# Patient Record
Sex: Male | Born: 1995 | Race: White | Hispanic: No | Marital: Single | Smoking: Never smoker
Health system: Southern US, Community
[De-identification: ages and names within clinical notes are randomized; demographics above are authoritative.]

## PROBLEM LIST (undated history)

## (undated) DIAGNOSIS — K3532 Acute appendicitis with perforation and localized peritonitis, without abscess: Secondary | ICD-10-CM

## (undated) DIAGNOSIS — K56609 Unspecified intestinal obstruction, unspecified as to partial versus complete obstruction: Secondary | ICD-10-CM

---

## 2017-01-28 ENCOUNTER — Inpatient Hospital Stay (HOSPITAL_COMMUNITY): Payer: BLUE CROSS/BLUE SHIELD

## 2017-01-28 ENCOUNTER — Encounter (HOSPITAL_COMMUNITY): Payer: Self-pay

## 2017-01-28 ENCOUNTER — Emergency Department (HOSPITAL_COMMUNITY): Payer: BLUE CROSS/BLUE SHIELD

## 2017-01-28 ENCOUNTER — Inpatient Hospital Stay (HOSPITAL_COMMUNITY)
Admission: EM | Admit: 2017-01-28 | Discharge: 2017-02-12 | DRG: 372 | Disposition: A | Discharge status: Home / Self Care | Payer: BLUE CROSS/BLUE SHIELD | Attending: Surgery | Admitting: Surgery

## 2017-01-28 DIAGNOSIS — R000 Tachycardia, unspecified: Secondary | ICD-10-CM | POA: Diagnosis present

## 2017-01-28 DIAGNOSIS — B9620 Unspecified Escherichia coli [E. coli] as the cause of diseases classified elsewhere: Secondary | ICD-10-CM | POA: Diagnosis present

## 2017-01-28 DIAGNOSIS — L0291 Cutaneous abscess, unspecified: Secondary | ICD-10-CM

## 2017-01-28 DIAGNOSIS — F17290 Nicotine dependence, other tobacco product, uncomplicated: Secondary | ICD-10-CM | POA: Diagnosis present

## 2017-01-28 DIAGNOSIS — K3533 Acute appendicitis with perforation and localized peritonitis, with abscess: Secondary | ICD-10-CM

## 2017-01-28 DIAGNOSIS — K3532 Acute appendicitis with perforation and localized peritonitis, without abscess: Secondary | ICD-10-CM | POA: Diagnosis present

## 2017-01-28 DIAGNOSIS — K353 Acute appendicitis with localized peritonitis: Secondary | ICD-10-CM | POA: Diagnosis present

## 2017-01-28 DIAGNOSIS — R188 Other ascites: Secondary | ICD-10-CM | POA: Diagnosis present

## 2017-01-28 DIAGNOSIS — K651 Peritoneal abscess: Secondary | ICD-10-CM

## 2017-01-28 DIAGNOSIS — K560 Paralytic ileus: Secondary | ICD-10-CM | POA: Diagnosis present

## 2017-01-28 DIAGNOSIS — Z0189 Encounter for other specified special examinations: Secondary | ICD-10-CM

## 2017-01-28 LAB — URINALYSIS, ROUTINE W REFLEX MICROSCOPIC
Bacteria, UA: NONE SEEN
Bilirubin Urine: NEGATIVE
Glucose, UA: NEGATIVE mg/dL
HGB URINE DIPSTICK: NEGATIVE
Ketones, ur: NEGATIVE mg/dL
Leukocytes, UA: NEGATIVE
Nitrite: NEGATIVE
PH: 5 (ref 5.0–8.0)
Protein, ur: 30 mg/dL — AB
Squamous Epithelial / LPF: NONE SEEN

## 2017-01-28 LAB — CBC
HCT: 40.6 % (ref 39.0–52.0)
Hemoglobin: 14.6 g/dL (ref 13.0–17.0)
MCH: 29.2 pg (ref 26.0–34.0)
MCHC: 36 g/dL (ref 30.0–36.0)
MCV: 81.2 fL (ref 78.0–100.0)
PLATELETS: 275 10*3/uL (ref 150–400)
RBC: 5 MIL/uL (ref 4.22–5.81)
RDW: 13.1 % (ref 11.5–15.5)
WBC: 21.8 10*3/uL — ABNORMAL HIGH (ref 4.0–10.5)

## 2017-01-28 LAB — COMPREHENSIVE METABOLIC PANEL
ALBUMIN: 3.6 g/dL (ref 3.5–5.0)
ALK PHOS: 79 U/L (ref 38–126)
ALT: 34 U/L (ref 17–63)
AST: 31 U/L (ref 15–41)
Anion gap: 13 (ref 5–15)
BILIRUBIN TOTAL: 1.8 mg/dL — AB (ref 0.3–1.2)
BUN: 24 mg/dL — ABNORMAL HIGH (ref 6–20)
CALCIUM: 9.3 mg/dL (ref 8.9–10.3)
CO2: 23 mmol/L (ref 22–32)
CREATININE: 1.22 mg/dL (ref 0.61–1.24)
Chloride: 94 mmol/L — ABNORMAL LOW (ref 101–111)
GFR calc Af Amer: >60 mL/min (ref >60)
GFR calc non Af Amer: >60 mL/min (ref >60)
Glucose, Bld: 127 mg/dL — ABNORMAL HIGH (ref 65–99)
Potassium: 3.7 mmol/L (ref 3.5–5.1)
SODIUM: 130 mmol/L — AB (ref 135–145)
Total Protein: 8 g/dL (ref 6.5–8.1)

## 2017-01-28 LAB — LIPASE, BLOOD: Lipase: 35 U/L (ref 11–51)

## 2017-01-28 LAB — I-STAT CG4 LACTIC ACID, ED: LACTIC ACID, VENOUS: 1.3 mmol/L (ref 0.5–1.9)

## 2017-01-28 MED ORDER — IOPAMIDOL (ISOVUE-300) INJECTION 61%
INTRAVENOUS | Status: AC
Start: 2017-01-28 — End: 2017-01-29
  Filled 2017-01-28: qty 100

## 2017-01-28 MED ORDER — ONDANSETRON 4 MG PO TBDP
4.0000 mg | ORAL_TABLET | Freq: Four times a day (QID) | ORAL | Status: DC | PRN
  Administered 2017-02-06: 4 mg via ORAL
  Filled 2017-01-28: qty 1

## 2017-01-28 MED ORDER — SODIUM CHLORIDE 0.9 % IV BOLUS (SEPSIS)
1000.0000 mL | Freq: Once | INTRAVENOUS | Status: AC
  Administered 2017-01-28: 1000 mL via INTRAVENOUS

## 2017-01-28 MED ORDER — KCL IN DEXTROSE-NACL 20-5-0.45 MEQ/L-%-% IV SOLN
INTRAVENOUS | Status: AC
  Administered 2017-01-28 – 2017-01-30 (×7): via INTRAVENOUS
  Filled 2017-01-28 (×6): qty 1000

## 2017-01-28 MED ORDER — METHOCARBAMOL 1000 MG/10ML IJ SOLN
500.0000 mg | Freq: Three times a day (TID) | INTRAVENOUS | Status: DC
  Administered 2017-01-28 – 2017-02-11 (×41): 500 mg via INTRAVENOUS
  Filled 2017-01-28 (×12): qty 550
  Filled 2017-01-28: qty 5
  Filled 2017-01-28 (×12): qty 550
  Filled 2017-01-28: qty 5
  Filled 2017-01-28 (×17): qty 550

## 2017-01-28 MED ORDER — HYDROMORPHONE HCL-NACL 0.5-0.9 MG/ML-% IV SOSY
0.5000 mg | PREFILLED_SYRINGE | INTRAVENOUS | Status: DC | PRN
  Administered 2017-01-28 (×2): 0.5 mg via INTRAVENOUS
  Administered 2017-01-29 – 2017-01-31 (×15): 1 mg via INTRAVENOUS
  Administered 2017-01-31: 10:00:00 0.5 mg via INTRAVENOUS
  Administered 2017-01-31: 05:00:00 1 mg via INTRAVENOUS
  Administered 2017-02-01 – 2017-02-09 (×51): 0.5 mg via INTRAVENOUS
  Administered 2017-02-10 (×2): 1 mg via INTRAVENOUS
  Administered 2017-02-10 (×2): 0.5 mg via INTRAVENOUS
  Filled 2017-01-28 (×2): qty 1
  Filled 2017-01-28 (×4): qty 2
  Filled 2017-01-28 (×3): qty 1
  Filled 2017-01-28: qty 2
  Filled 2017-01-28 (×5): qty 1
  Filled 2017-01-28: qty 2
  Filled 2017-01-28: qty 1
  Filled 2017-01-28: qty 2
  Filled 2017-01-28: qty 1
  Filled 2017-01-28 (×3): qty 2
  Filled 2017-01-28: qty 1
  Filled 2017-01-28: qty 2
  Filled 2017-01-28: qty 1
  Filled 2017-01-28 (×2): qty 2
  Filled 2017-01-28: qty 1
  Filled 2017-01-28: qty 2
  Filled 2017-01-28: qty 1
  Filled 2017-01-28 (×2): qty 2
  Filled 2017-01-28 (×10): qty 1
  Filled 2017-01-28: qty 2
  Filled 2017-01-28 (×13): qty 1
  Filled 2017-01-28: qty 2
  Filled 2017-01-28 (×3): qty 1
  Filled 2017-01-28: qty 2
  Filled 2017-01-28 (×2): qty 1
  Filled 2017-01-28: qty 2
  Filled 2017-01-28 (×8): qty 1
  Filled 2017-01-28 (×2): qty 2
  Filled 2017-01-28 (×2): qty 1
  Filled 2017-01-28: qty 2
  Filled 2017-01-28 (×2): qty 1

## 2017-01-28 MED ORDER — PIPERACILLIN-TAZOBACTAM 3.375 G IVPB 30 MIN
3.3750 g | Freq: Once | INTRAVENOUS | Status: AC
  Administered 2017-01-28: 3.375 g via INTRAVENOUS
  Filled 2017-01-28: qty 50

## 2017-01-28 MED ORDER — IOPAMIDOL (ISOVUE-300) INJECTION 61%
100.0000 mL | Freq: Once | INTRAVENOUS | Status: AC | PRN
  Administered 2017-01-28: 100 mL via INTRAVENOUS

## 2017-01-28 MED ORDER — MORPHINE SULFATE (PF) 4 MG/ML IV SOLN
4.0000 mg | Freq: Once | INTRAVENOUS | Status: AC
  Administered 2017-01-28: 4 mg via INTRAVENOUS
  Filled 2017-01-28: qty 1

## 2017-01-28 MED ORDER — PIPERACILLIN-TAZOBACTAM 3.375 G IVPB
3.3750 g | Freq: Three times a day (TID) | INTRAVENOUS | Status: DC
  Administered 2017-01-28 – 2017-01-31 (×7): 3.375 g via INTRAVENOUS
  Filled 2017-01-28 (×7): qty 50

## 2017-01-28 MED ORDER — ONDANSETRON HCL 4 MG/2ML IJ SOLN
4.0000 mg | Freq: Once | INTRAMUSCULAR | Status: DC
  Filled 2017-01-28: qty 2

## 2017-01-28 MED ORDER — DIPHENHYDRAMINE HCL 25 MG PO CAPS: 25.0000 mg | ORAL_CAPSULE | Freq: Four times a day (QID) | ORAL | Status: DC | PRN

## 2017-01-28 MED ORDER — ONDANSETRON HCL 4 MG/2ML IJ SOLN
4.0000 mg | Freq: Four times a day (QID) | INTRAMUSCULAR | Status: DC | PRN
  Administered 2017-01-28 – 2017-02-06 (×10): 4 mg via INTRAVENOUS
  Filled 2017-01-28 (×8): qty 2

## 2017-01-28 MED ORDER — ACETAMINOPHEN 500 MG PO TABS
500.0000 mg | ORAL_TABLET | ORAL | Status: DC | PRN
  Administered 2017-02-07: 500 mg via ORAL
  Filled 2017-01-28: qty 1

## 2017-01-28 MED ORDER — DIPHENHYDRAMINE HCL 50 MG/ML IJ SOLN: 25.0000 mg | Freq: Four times a day (QID) | INTRAMUSCULAR | Status: DC | PRN

## 2017-01-28 MED ORDER — ONDANSETRON HCL 4 MG/2ML IJ SOLN
4.0000 mg | Freq: Once | INTRAMUSCULAR | Status: AC
  Administered 2017-01-28: 4 mg via INTRAVENOUS
  Filled 2017-01-28: qty 2

## 2017-01-28 NOTE — Consult Note (Signed)
Chief Complaint: Patient was seen in consultation today for CT-guided pelvic abscess drainage Chief Complaint  Patient presents with  . Nausea  . Abdominal Pain    bloating  . Abnormal Lab    WBC-21.5  . Emesis     Referring Physician(s): Martin,M  Supervising Physician: Simonne Come  Patient Status: [facility] - In-pt  History of Present Illness: Adam Mcdowell is a 21 y.o. male who was referred to [facility] ED today from [facility] secondary to 4 day history of abdominal pain, bloating, sweats, nausea, vomiting. Subsequent CT scan revealed:  1. Acute, ruptured appendicitis with two large intra-abdominal abscesses, one inferior to the liver and tracking along the right paracolic gutter, and a second one in the deep pelvis extending along the left inferior paracolic gutter. 2. Small bowel obstruction to the level of the distal ileum, where there is a transition point that is likely related to the prominent mesenteric reactive changes. 3. Trace right pleural effusion  Request now received from surgery for CT-guided drainage of the pelvic abscess. Latest temp 99.6, WBC  21.8, creatinine 1.22.  History reviewed. No pertinent past medical history.  History reviewed. No pertinent surgical history.  Allergies: Patient has no known allergies.  Medications: Prior to Admission medications   Medication Sig Start Date End Date Taking? Authorizing Provider  ibuprofen (ADVIL,MOTRIN) 200 MG tablet Take 400 mg by mouth every 6 (six) hours as needed for moderate pain or cramping.   Yes [provider]     Family History  Problem Relation Age of Onset  . Hypertension Mother     Social History   Social History  . Marital status: Single    Spouse name: N/A  . Number of children: N/A  . Years of education: N/A   Social History Main Topics  . Smoking status: Current Every Day Smoker    Types: E-cigarettes  . Smokeless tobacco: Never Used  . Alcohol  use No  . Drug use: No  . Sexual activity: Not Asked   Other Topics Concern  . None   Social History Narrative  . None      Review of Systems see above  Vital Signs: BP 132/75 (BP Location: Right Arm)   Pulse 100   Temp 99.6 F (37.6 C) (Oral)   Resp 20   Ht 5' 5.5" (1.664 m)   Wt 175 lb (79.4 kg)   SpO2 98%   BMI 28.68 kg/m   Physical Exam awake,slightly drowsy; NG tube in place; chest with slightly diminished breath sounds right base, left clear. Heart with slightly tachycardic but regular rhythm; abdomen distended, few bowel sounds, mod tender right lower quadrant; no LE edema  Mallampati Score:     Imaging: Ct Abdomen Pelvis W Contrast  Result Date: 01/28/2017 CLINICAL DATA:  Abdominal pain with nausea and vomiting. EXAM: CT ABDOMEN AND PELVIS WITH CONTRAST TECHNIQUE: Multidetector CT imaging of the abdomen and pelvis was performed using the standard protocol following bolus administration of intravenous contrast. CONTRAST:  ISOVUE-300 IOPAMIDOL (ISOVUE-300) INJECTION 61% COMPARISON:  None. FINDINGS: Lower chest:  Trace right pleural effusion. Hepatobiliary: No focal liver abnormality is seen. No gallstones, gallbladder wall thickening, or biliary dilatation. Pancreas: Unremarkable. No pancreatic ductal dilatation or surrounding inflammatory changes. Spleen: Normal in size without focal abnormality. Adrenals/Urinary Tract: Adrenal glands are unremarkable. Kidneys are normal, without renal calculi, focal lesion, or hydronephrosis. Mild prominence of the right greater than left mid ureters is likely reactive. Bladder is  unremarkable. Stomach/Bowel: The appendix is markedly dilated with surrounding periappendiceal inflammatory stranding and an appendicolith at the appendix base. There are multiple dilated loops of small bowel with air-fluid levels to the level of the distal ileum. A few loops of proximal ileum remain nondilated. There is reactive wall thickening of the  cecum and ascending colon, as well as the terminal ileum. The stomach is within normal limits. Vascular/Lymphatic: No significant vascular findings are present. No enlarged abdominal or pelvic lymph nodes. Multiple prominent mesenteric lymph nodes are likely reactive. Reproductive: Prostate is unremarkable. Other: There is a loculated, rim enhancing fluid collection inferior to the liver, measuring approximately 11.0 x 7.3 x 19.1 cm, extending along the right paracolic gutter into the pelvis. There is an additional loculated fluid collection containing a few foci of air in the deep pelvis between the bladder and rectum measuring approximately 2.3 x 4.4 x 2.6 cm. This fluid collection extends superiorly along the bilateral paracolic gutters. The larger portion in the left lower pelvis measures approximately 6.6 x 5.1 x 5.4 cm. Musculoskeletal: No acute or significant osseous findings. IMPRESSION: 1. Acute, ruptured appendicitis with two large intra-abdominal abscesses, one inferior to the liver and tracking along the right paracolic gutter, and a second one in the deep pelvis extending along the left inferior paracolic gutter. 2. Small bowel obstruction to the level of the distal ileum, where there is a transition point that is likely related to the prominent mesenteric reactive changes. 3. Trace right pleural effusion. These results were called by telephone at the time of interpretation on 01/28/2017 at 1:11 pm to Dr. Shaune PollackAMERON ISAACS , who verbally acknowledged these results. Electronically Signed   By: Obie DredgeWilliam T Derry M.D.   On: 01/28/2017 13:13   Dg Abd Portable 1v  Result Date: 01/28/2017 CLINICAL DATA:  21 year old male with ruptured appendicitis. Nasogastric tube placement EXAM: PORTABLE ABDOMEN - 1 VIEW COMPARISON:  CT of the same day FINDINGS: Gastric tube terminates within the left upper quadrant. Gaseous distention of small bowel with relative decompression of the colon. Excreted contrast within the  bilateral kidneys and ureters. IMPRESSION: Gastric tube terminates within the left upper quadrant. Gaseous distention of small bowel, either ileus or small-bowel obstruction. Excreted contrast within the bilateral collecting system and ureters. Electronically Signed   By: Gilmer MorJaime  Wagner D.O.   On: 01/28/2017 15:58    Labs:  CBC:  Recent Labs  01/28/17 1120  WBC 21.8*  HGB 14.6  HCT 40.6  PLT 275    COAGS: No results for input(s): INR, APTT in the last 8760 hours.  BMP:  Recent Labs  01/28/17 1120  NA 130*  K 3.7  CL 94*  CO2 23  GLUCOSE 127*  BUN 24*  CALCIUM 9.3  CREATININE 1.22  GFRNONAA >60  GFRAA >60    LIVER FUNCTION TESTS:  Recent Labs  01/28/17 1120  BILITOT 1.8*  AST 31  ALT 34  ALKPHOS 79  PROT 8.0  ALBUMIN 3.6    TUMOR MARKERS: No results for input(s): AFPTM, CEA, CA199, CHROMGRNA in the last 8760 hours.  Assessment and Plan: 21 y.o. male who [facility] to 4 day history of abdominal pain, bloating, sweats, nausea, vomiting. Subsequent CT scan revealed:  1. Acute, ruptured appendicitis with two large intra-abdominal abscesses, one inferior to the liver and tracking along the right paracolic gutter, and a second one in the deep pelvis extending along the left inferior paracolic gutter.  2. Small bowel obstruction to the level of the distal ileum, where there is a transition point that is likely related to the prominent mesenteric reactive changes. 3. Trace right pleural effusion  Request now received from surgery for CT-guided drainage of the pelvic abscess. Latest temp 99.6, WBC  21.8, creatinine 1.22. Imaging studies have been reviewed by Dr. Fredia Sorrow and abscess appears amenable to drainage.Risks and benefits discussed with the patient/father including bleeding, infection, damage to adjacent structures, bowel perforation/fistula connection, and sepsis.All of the patient's  questions were answered, patient is agreeable to proceed.Consent signed and in chart. Procedure scheduled for 9/11.     Thank you for this interesting consult.  I greatly enjoyed meeting Adam Mcdowell and look forward to participating in their care.  A copy of this report was sent to the requesting provider on this date.  Electronically Signed: D. Jeananne Rama, PA-C 01/28/2017, 4:09 PM   I spent a total of 25 minutes in face to face in clinical consultation, greater than 50% of which was counseling/coordinating care for CT guided drainage of pelvic abscess

## 2017-01-28 NOTE — ED Triage Notes (Addendum)
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 Patient had a WBC-21.5, abdominal pain x 2 days with N/V. No BM in 2 days. Patient states he began having abdominal distention and bloating x 4 days ago.

## 2017-01-28 NOTE — H&P (Signed)
[facility] Admission Note  Adam Mcdowell 1995-08-25  [id number].    Requesting MD: Adam Mcdowell, M.D. Chief Complaint/Reason for Consult:ruptured appendicitis  HPI:  [name] is a 21 year old white male with no previous past medical history who presented to [place] emergency department from the [place] [facility] with a chief complaint of 4 days of abdominal pain. The patient reports right lower quadrant abdominal pain that started 9/7. Pain described as sharp, constant right lower quadrant pain with intermittent radiation to central lower abdomen. Denies aggravating or alleviating factors. Associated symptoms include abdominal bloating, belching, hiccups, cold sweats, nausea, and vomiting. The patient reports a small loose bowel movement this morning but states bowel function has been irregular. He initially thought he had the flu and attempted to take a cold and flu medication that he got over-the-counter with little relief of symptoms. Due to persistent pain and intolerance of oral intake he presented to the emergency department for evaluation. The patient denies a history of high blood pressure, diabetes, asthma, or cardiac issues. He denies regular medication use. He denies alcohol, tobacco, or illicit drug use. The patient's father is at the bedside.  Workup in the emergency department was significant for leukocytosis of 21.5 and a CT scan showing a dilated appendix with a appendicolith at the base associated with inflammatory stranding,dilated small bowel, and reactive wall thickening of the cecum and ascending colon. Also noted are two, large intra-abdominal abscesses-one 11 x 7 x 19 cm abscess inferior to the liver and one abscess in the pelvis and paracolic gutter.   ROS: Review of Systems  Constitutional: Positive for chills and fever.  Respiratory: Negative for sputum production.   Cardiovascular: Negative for chest pain.  Gastrointestinal: Positive for  abdominal pain, nausea and vomiting. Negative for blood in stool and melena.  Genitourinary: Negative.   Musculoskeletal: Negative.   All other systems reviewed and are negative.   Family History  Problem Relation Age of Onset  . Hypertension Mother     History reviewed. No pertinent past medical history.  History reviewed. No pertinent surgical history.  Social History:  reports that he has been smoking E-cigarettes.  He has never used smokeless tobacco. He reports that he does not drink alcohol or use drugs.  Allergies: No Known Allergies   (Not in a hospital admission)  Blood pressure 134/80, pulse (!) 102, temperature 98.3 F (36.8 C), temperature source Oral, resp. rate 16, height 5' 5.5" (1.664 m), weight 79.4 kg (175 lb), SpO2 99 %. Physical Exam: Physical Exam  Constitutional: He is oriented to person, place, and time. He appears well-developed and well-nourished. No distress.  Appears uncomfortable, belching regularly  HENT:  Head: Normocephalic and atraumatic.  Right Ear: External ear normal.  Left Ear: External ear normal.  Mouth/Throat: Oropharynx is clear and moist.  Eyes: Pupils are equal, round, and reactive to light. EOM are normal. Right eye exhibits no discharge. Left eye exhibits no discharge.  Small left subconjunctival hemorrhage  Neck: Normal range of motion. Neck supple. No tracheal deviation present.  Cardiovascular: Normal rate, regular rhythm, normal heart sounds and intact distal pulses.  Exam reveals no friction rub.   No murmur heard. Pulmonary/Chest: Effort normal and breath sounds normal. No stridor. No respiratory distress. He has no wheezes. He has no rales. He exhibits no tenderness.  Abdominal: Soft. He exhibits distension. He exhibits no mass. There is tenderness. There is rebound and guarding. No hernia.  Protuberant, tympanic abdomen. Global tenderness, worse  in the right lower quadrant, with guarding.  Musculoskeletal: Normal range of  motion. He exhibits no edema, tenderness or deformity.  Lymphadenopathy:    He has no cervical adenopathy.  Neurological: He is alert and oriented to person, place, and time. No sensory deficit.  Skin: Skin is warm and dry. No rash noted. He is not diaphoretic.  Psychiatric: He has a normal mood and affect. His behavior is normal.    Results for orders placed or performed during the hospital encounter of 01/28/17 (from the past 48 hour(s))  Lipase, blood     Status: None   Collection Time: 01/28/17 11:20 AM  Result Value Ref Range   Lipase 35 11 - 51 U/L  Comprehensive metabolic panel     Status: Abnormal   Collection Time: 01/28/17 11:20 AM  Result Value Ref Range   Sodium 130 (L) 135 - 145 mmol/L   Potassium 3.7 3.5 - 5.1 mmol/L   Chloride 94 (L) 101 - 111 mmol/L   CO2 23 22 - 32 mmol/L   Glucose, Bld 127 (H) 65 - 99 mg/dL   BUN 24 (H) 6 - 20 mg/dL   Creatinine, Ser 1.22 0.61 - 1.24 mg/dL   Calcium 9.3 8.9 - 10.3 mg/dL   Total Protein 8.0 6.5 - 8.1 g/dL   Albumin 3.6 3.5 - 5.0 g/dL   AST 31 15 - 41 U/L   ALT 34 17 - 63 U/L   Alkaline Phosphatase 79 38 - 126 U/L   Total Bilirubin 1.8 (H) 0.3 - 1.2 mg/dL   GFR calc non Af Amer >60 >60 mL/min   GFR calc Af Amer >60 >60 mL/min    Comment: (NOTE) The eGFR has been calculated using the CKD EPI equation. This calculation has not been validated in all clinical situations. eGFR's persistently <60 mL/min signify possible Chronic Kidney Disease.    Anion gap 13 5 - 15  CBC     Status: Abnormal   Collection Time: 01/28/17 11:20 AM  Result Value Ref Range   WBC 21.8 (H) 4.0 - 10.5 K/uL   RBC 5.00 4.22 - 5.81 MIL/uL   Hemoglobin 14.6 13.0 - 17.0 g/dL   HCT 40.6 39.0 - 52.0 %   MCV 81.2 78.0 - 100.0 fL   MCH 29.2 26.0 - 34.0 pg   MCHC 36.0 30.0 - 36.0 g/dL   RDW 13.1 11.5 - 15.5 %   Platelets 275 150 - 400 K/uL  I-Stat CG4 Lactic Acid, ED     Status: None   Collection Time: 01/28/17 11:52 AM  Result Value Ref Range   Lactic  Acid, Venous 1.30 0.5 - 1.9 mmol/L   Ct Abdomen Pelvis W Contrast  Result Date: 01/28/2017 CLINICAL DATA:  Abdominal pain with nausea and vomiting. EXAM: CT ABDOMEN AND PELVIS WITH CONTRAST TECHNIQUE: Multidetector CT imaging of the abdomen and pelvis was performed using the standard protocol following bolus administration of intravenous contrast. CONTRAST:  174m ISOVUE-300 IOPAMIDOL (ISOVUE-300) INJECTION 61% COMPARISON:  None. FINDINGS: Lower chest:  Trace right pleural effusion. Hepatobiliary: No focal liver abnormality is seen. No gallstones, gallbladder wall thickening, or biliary dilatation. Pancreas: Unremarkable. No pancreatic ductal dilatation or surrounding inflammatory changes. Spleen: Normal in size without focal abnormality. Adrenals/Urinary Tract: Adrenal glands are unremarkable. Kidneys are normal, without renal calculi, focal lesion, or hydronephrosis. Mild prominence of the right greater than left mid ureters is likely reactive. Bladder is unremarkable. Stomach/Bowel: The appendix is markedly dilated with surrounding periappendiceal inflammatory stranding and an  appendicolith at the appendix base. There are multiple dilated loops of small bowel with air-fluid levels to the level of the distal ileum. A few loops of proximal ileum remain nondilated. There is reactive wall thickening of the cecum and ascending colon, as well as the terminal ileum. The stomach is within normal limits. Vascular/Lymphatic: No significant vascular findings are present. No enlarged abdominal or pelvic lymph nodes. Multiple prominent mesenteric lymph nodes are likely reactive. Reproductive: Prostate is unremarkable. Other: There is a loculated, rim enhancing fluid collection inferior to the liver, measuring approximately 11.0 x 7.3 x 19.1 cm, extending along the right paracolic gutter into the pelvis. There is an additional loculated fluid collection containing a few foci of air in the deep pelvis between the bladder  and rectum measuring approximately 2.3 x 4.4 x 2.6 cm. This fluid collection extends superiorly along the bilateral paracolic gutters. The larger portion in the left lower pelvis measures approximately 6.6 x 5.1 x 5.4 cm. Musculoskeletal: No acute or significant osseous findings. IMPRESSION: 1. Acute, ruptured appendicitis with two large intra-abdominal abscesses, one inferior to the liver and tracking along the right paracolic gutter, and a second one in the deep pelvis extending along the left inferior paracolic gutter. 2. Small bowel obstruction to the level of the distal ileum, where there is a transition point that is likely related to the prominent mesenteric reactive changes. 3. Trace right pleural effusion. These results were called by telephone at the time of interpretation on 01/28/2017 at 1:11 pm to Dr. Duffy Mcdowell , who verbally acknowledged these results. Electronically Signed   By: Titus Dubin M.D.   On: 01/28/2017 13:13   Assessment/Plan Acute appendicitis with rupture and multiple intra-abdominal abscesses Admit to [facility] service NPO/bowel rest, IV fluids, place nasogastric tube IV Zosyn Consult interventional radiology for percutaneous drainage of fluid collections.  PRN pain control with IV Dilaudid and Robaxin as well as PRN antiemetics Mobilize as tolerated A.M. labs   Afebrile in ED. Mild sinus tachycardia. Normotensive. Follow vitals and exam. Failure to improve on IV abx or with perc drainage may warrant surgery.   Jill Alexanders, [facility] Surgery 01/28/2017, 2:08 PM Pager: [phone] Consults: [phone] Mon-Fri 7:00 am-4:30 pm Sat-Sun 7:00 am-11:30 am

## 2017-01-28 NOTE — ED Notes (Signed)
Urinal is @ bedside. Pt cannot use restroom at the time.

## 2017-01-28 NOTE — ED Notes (Signed)
Bed: WLPT1 Expected date:  Expected time:  Means of arrival:  Comments: 

## 2017-01-28 NOTE — ED Notes (Signed)
Bed: WTR6 Expected date:  Expected time:  Means of arrival:  Comments: 

## 2017-01-28 NOTE — ED Provider Notes (Signed)
[facility]-EMERGENCY DEPT Provider Note   CSN: [id number] Arrival date & time: 01/28/17  1038     History   Chief Complaint Chief Complaint  Patient presents with  . Nausea  . Abdominal Pain    bloating  . Abnormal Lab    WBC-21.5  . Emesis    HPI Adam Mcdowell is a 21 y.o. male.  HPI   21 yo M here with abdominal pain. Pt reports that his sx started 2-3 days ago. He began with aching, dull, throbbing RLQ pain. Over the next 24 hours his pain generalized to his lower abdomen, then got slightly better yesterday. However, last night he began developing worsening abd pain with nausea, constipation, and diffuse chills. No flank pain. No dysuria.  Pain is worse with palpation and movement. No alleviating factors. He has had no appetite.  History reviewed. No pertinent past medical history.  Patient Active Problem List   Diagnosis Date Noted  . Acute appendicitis with rupture 01/28/2017    History reviewed. No pertinent surgical history.     Home Medications    Prior to Admission medications   Medication Sig Start Date End Date Taking? Authorizing Provider  ibuprofen (ADVIL,MOTRIN) 200 MG tablet Take 400 mg by mouth every 6 (six) hours as needed for moderate pain or cramping.   Yes [provider]    Family History Family History  Problem Relation Age of Onset  . Hypertension Mother     Social History Social History  Substance Use Topics  . Smoking status: Current Every Day Smoker    Types: E-cigarettes  . Smokeless tobacco: Never Used  . Alcohol use No     Allergies   Patient has no known allergies.   Review of Systems Review of Systems  Constitutional: Positive for chills and fatigue. Negative for fever.  HENT: Negative for congestion and rhinorrhea.   Eyes: Negative for visual disturbance.  Respiratory: Negative for cough, shortness of breath and wheezing.   Cardiovascular: Negative for chest pain and leg swelling.  Gastrointestinal: Positive  for abdominal distention, abdominal pain, nausea and vomiting. Negative for diarrhea.  Genitourinary: Negative for dysuria and flank pain.  Musculoskeletal: Negative for neck pain and neck stiffness.  Skin: Negative for rash and wound.  Allergic/Immunologic: Negative for immunocompromised state.  Neurological: Negative for syncope, weakness and headaches.  All other systems reviewed and are negative.    Physical Exam Updated Vital Signs BP 132/75 (BP Location: Right Arm)   Pulse 100   Temp 99.6 F (37.6 C) (Oral)   Resp 20   Ht 5' 5.5" (1.664 m)   Wt 79.4 kg (175 lb)   SpO2 98%   BMI 28.68 kg/m   Physical Exam  Constitutional: He is oriented to person, place, and time. He appears well-developed and well-nourished. No distress.  HENT:  Head: Normocephalic and atraumatic.  Eyes: Conjunctivae are normal.  Neck: Neck supple.  Cardiovascular: Normal rate, regular rhythm and normal heart sounds.  Exam reveals no friction rub.   No murmur heard. Pulmonary/Chest: Effort normal and breath sounds normal. No respiratory distress. He has no wheezes. He has no rales.  Abdominal: Soft. He exhibits distension. There is tenderness. There is rebound and guarding.  Marked abdominal distension with guarding, rebound throughout. TTP is worst in RLQ.   Musculoskeletal: He exhibits no edema.  Neurological: He is alert and oriented to person, place, and time. He exhibits normal muscle tone.  Skin: Skin is warm. Capillary refill takes less than 2 seconds.  Psychiatric: He has a normal mood and affect.  Nursing note and vitals reviewed.    ED Treatments / Results  Labs (all labs ordered are listed, but only abnormal results are displayed) Labs Reviewed  COMPREHENSIVE METABOLIC PANEL - Abnormal; Notable for the following:       Result Value   Sodium 130 (*)    Chloride 94 (*)    Glucose, Bld 127 (*)    BUN 24 (*)    Total Bilirubin 1.8 (*)    All other components within normal limits  CBC  - Abnormal; Notable for the following:    WBC 21.8 (*)    All other components within normal limits  URINALYSIS, ROUTINE W REFLEX MICROSCOPIC - Abnormal; Notable for the following:    Specific Gravity, Urine >1.046 (*)    Protein, ur 30 (*)    All other components within normal limits  LIPASE, BLOOD  HIV ANTIBODY (ROUTINE TESTING)  BASIC METABOLIC PANEL  CBC  PROTIME-INR  I-STAT CG4 LACTIC ACID, ED  I-STAT CG4 LACTIC ACID, ED    EKG  EKG Interpretation None       Radiology Ct Abdomen Pelvis W Contrast  Result Date: 01/28/2017 CLINICAL DATA:  Abdominal pain with nausea and vomiting. EXAM: CT ABDOMEN AND PELVIS WITH CONTRAST TECHNIQUE: Multidetector CT imaging of the abdomen and pelvis was performed using the standard protocol following bolus administration of intravenous contrast. CONTRAST:  100mL ISOVUE-300 IOPAMIDOL (ISOVUE-300) INJECTION 61% COMPARISON:  None. FINDINGS: Lower chest:  Trace right pleural effusion. Hepatobiliary: No focal liver abnormality is seen. No gallstones, gallbladder wall thickening, or biliary dilatation. Pancreas: Unremarkable. No pancreatic ductal dilatation or surrounding inflammatory changes. Spleen: Normal in size without focal abnormality. Adrenals/Urinary Tract: Adrenal glands are unremarkable. Kidneys are normal, without renal calculi, focal lesion, or hydronephrosis. Mild prominence of the right greater than left mid ureters is likely reactive. Bladder is unremarkable. Stomach/Bowel: The appendix is markedly dilated with surrounding periappendiceal inflammatory stranding and an appendicolith at the appendix base. There are multiple dilated loops of small bowel with air-fluid levels to the level of the distal ileum. A few loops of proximal ileum remain nondilated. There is reactive wall thickening of the cecum and ascending colon, as well as the terminal ileum. The stomach is within normal limits. Vascular/Lymphatic: No significant vascular findings are  present. No enlarged abdominal or pelvic lymph nodes. Multiple prominent mesenteric lymph nodes are likely reactive. Reproductive: Prostate is unremarkable. Other: There is a loculated, rim enhancing fluid collection inferior to the liver, measuring approximately 11.0 x 7.3 x 19.1 cm, extending along the right paracolic gutter into the pelvis. There is an additional loculated fluid collection containing a few foci of air in the deep pelvis between the bladder and rectum measuring approximately 2.3 x 4.4 x 2.6 cm. This fluid collection extends superiorly along the bilateral paracolic gutters. The larger portion in the left lower pelvis measures approximately 6.6 x 5.1 x 5.4 cm. Musculoskeletal: No acute or significant osseous findings. IMPRESSION: 1. Acute, ruptured appendicitis with two large intra-abdominal abscesses, one inferior to the liver and tracking along the right paracolic gutter, and a second one in the deep pelvis extending along the left inferior paracolic gutter. 2. Small bowel obstruction to the level of the distal ileum, where there is a transition point that is likely related to the prominent mesenteric reactive changes. 3. Trace right pleural effusion. These results were called by telephone at the time of interpretation on 01/28/2017 at 1:11 pm to Dr.  Shaune Pollack , who verbally acknowledged these results. Electronically Signed   By: Obie Dredge M.D.   On: 01/28/2017 13:13   Dg Abd Portable 1v  Result Date: 01/28/2017 CLINICAL DATA:  21 year old male with ruptured appendicitis. Nasogastric tube placement EXAM: PORTABLE ABDOMEN - 1 VIEW COMPARISON:  CT of the same day FINDINGS: Gastric tube terminates within the left upper quadrant. Gaseous distention of small bowel with relative decompression of the colon. Excreted contrast within the bilateral kidneys and ureters. IMPRESSION: Gastric tube terminates within the left upper quadrant. Gaseous distention of small bowel, either ileus or  small-bowel obstruction. Excreted contrast within the bilateral collecting system and ureters. Electronically Signed   By: Gilmer Mor D.O.   On: 01/28/2017 15:58    Procedures .Critical Care Performed by: Shaune Pollack Authorized by: Shaune Pollack   Critical care provider statement:    Critical care time (minutes):  35   Critical care time was exclusive of:  Separately billable procedures and treating other patients and teaching time   Critical care was necessary to treat or prevent imminent or life-threatening deterioration of the following conditions:  Dehydration, hepatic failure, metabolic crisis, shock and sepsis   Critical care was time spent personally by me on the following activities:  Development of treatment plan with patient or surrogate, discussions with consultants, evaluation of patient's response to treatment, examination of patient, obtaining history from patient or surrogate, ordering and performing treatments and interventions, ordering and review of laboratory studies, ordering and review of radiographic studies, pulse oximetry, re-evaluation of patient's condition and review of old charts   I assumed direction of critical care for this patient from another provider in my specialty: no     (including critical care time)  Medications Ordered in ED Medications  piperacillin-tazobactam (ZOSYN) IVPB 3.375 g (not administered)  ondansetron (ZOFRAN) injection 4 mg (not administered)  dextrose 5 % and 0.45 % NaCl with KCl 20 mEq/L infusion ( Intravenous New Bag/Given 01/28/17 1550)  HYDROmorphone (DILAUDID) injection 0.5-1 mg (0.5 mg Intravenous Given 01/28/17 1845)  diphenhydrAMINE (BENADRYL) capsule 25 mg (not administered)    Or  diphenhydrAMINE (BENADRYL) injection 25 mg (not administered)  ondansetron (ZOFRAN-ODT) disintegrating tablet 4 mg ( Oral See Alternative 01/28/17 1550)    Or  ondansetron (ZOFRAN) injection 4 mg (4 mg Intravenous Given 01/28/17 1550)    methocarbamol (ROBAXIN) 500 mg in dextrose 5 % 50 mL IVPB (0 mg Intravenous Stopped 01/28/17 1756)  acetaminophen (TYLENOL) tablet 500 mg (not administered)  sodium chloride 0.9 % bolus 1,000 mL (0 mLs Intravenous Stopped 01/28/17 1302)  sodium chloride 0.9 % bolus 1,000 mL (0 mLs Intravenous Stopped 01/28/17 1302)  morphine 4 MG/ML injection 4 mg (4 mg Intravenous Given 01/28/17 1133)  ondansetron (ZOFRAN) injection 4 mg (4 mg Intravenous Given 01/28/17 1133)  iopamidol (ISOVUE-300) 61 % injection 100 mL (100 mLs Intravenous Contrast Given 01/28/17 1233)  piperacillin-tazobactam (ZOSYN) IVPB 3.375 g (0 g Intravenous Stopped 01/28/17 1416)     Initial Impression / Assessment and Plan / ED Course  I have reviewed the triage vital signs and the nursing notes.  Pertinent labs & imaging results that were available during my care of the patient were reviewed by me and considered in my medical decision making (see chart for details).     21 yo M here with diffuse abdominal distension and pain. Concern for peritonitis 2/2 perforated appendicitis. KUB shows obstruction but given his lack of risk factors, I'm worried this may be 2/2 ileus  or mesenteric inflammation from a primary appendicitis. Will give fluids, start Zosyn, and obtain CT stat.  CT scan shows perforated appendicitis with large, multiple intra-abdominal abscesses. Surgery consulted, as well as Interventional Radiology. Admit to [facility].  Final Clinical Impressions(s) / ED Diagnoses   Final diagnoses:  Abscess  Acute appendicitis with rupture  Encounter for imaging study to confirm nasogastric (NG) tube placement    New Prescriptions Current Discharge Medication List       Shaune Pollack, MD 01/28/17 2009

## 2017-01-29 ENCOUNTER — Encounter (HOSPITAL_COMMUNITY): Payer: Self-pay | Admitting: Radiology

## 2017-01-29 ENCOUNTER — Inpatient Hospital Stay (HOSPITAL_COMMUNITY): Payer: BLUE CROSS/BLUE SHIELD

## 2017-01-29 LAB — BASIC METABOLIC PANEL
Anion gap: 9 (ref 5–15)
BUN: 19 mg/dL (ref 6–20)
CHLORIDE: 102 mmol/L (ref 101–111)
CO2: 23 mmol/L (ref 22–32)
CREATININE: 1.15 mg/dL (ref 0.61–1.24)
Calcium: 8.1 mg/dL — ABNORMAL LOW (ref 8.9–10.3)
GFR calc Af Amer: >60 mL/min (ref >60)
GFR calc non Af Amer: >60 mL/min (ref >60)
Glucose, Bld: 141 mg/dL — ABNORMAL HIGH (ref 65–99)
Potassium: 4.1 mmol/L (ref 3.5–5.1)
SODIUM: 134 mmol/L — AB (ref 135–145)

## 2017-01-29 LAB — CBC
HCT: 35.4 % — ABNORMAL LOW (ref 39.0–52.0)
Hemoglobin: 12.1 g/dL — ABNORMAL LOW (ref 13.0–17.0)
MCH: 28.8 pg (ref 26.0–34.0)
MCHC: 34.2 g/dL (ref 30.0–36.0)
MCV: 84.3 fL (ref 78.0–100.0)
PLATELETS: 291 10*3/uL (ref 150–400)
RBC: 4.2 MIL/uL — ABNORMAL LOW (ref 4.22–5.81)
RDW: 13.9 % (ref 11.5–15.5)
WBC: 16.4 10*3/uL — ABNORMAL HIGH (ref 4.0–10.5)

## 2017-01-29 LAB — PROTIME-INR
INR: 1.15
Prothrombin Time: 14.7 seconds (ref 11.4–15.2)

## 2017-01-29 LAB — HIV ANTIBODY (ROUTINE TESTING W REFLEX): HIV Screen 4th Generation wRfx: NONREACTIVE

## 2017-01-29 MED ORDER — FENTANYL CITRATE (PF) 100 MCG/2ML IJ SOLN
INTRAMUSCULAR | Status: AC
  Filled 2017-01-29: qty 4

## 2017-01-29 MED ORDER — MIDAZOLAM HCL 2 MG/2ML IJ SOLN
INTRAMUSCULAR | Status: AC | PRN
  Administered 2017-01-29 (×3): 1 mg via INTRAVENOUS

## 2017-01-29 MED ORDER — PHENOL 1.4 % MT LIQD
2.0000 | OROMUCOSAL | Status: DC | PRN
Start: 2017-01-29 — End: 2017-02-12
  Administered 2017-01-29 (×2): 2 via OROMUCOSAL
  Filled 2017-01-29: qty 177

## 2017-01-29 MED ORDER — FENTANYL CITRATE (PF) 100 MCG/2ML IJ SOLN
INTRAMUSCULAR | Status: AC | PRN
  Administered 2017-01-29 (×2): 50 ug via INTRAVENOUS

## 2017-01-29 MED ORDER — MIDAZOLAM HCL 2 MG/2ML IJ SOLN
INTRAMUSCULAR | Status: AC
  Filled 2017-01-29: qty 4

## 2017-01-29 NOTE — Procedures (Signed)
Pre procedural Dx: Ruptured appendicits Post procedural Dx: Same  Technically successful CT guided placed of a 10 Fr drainage catheter placement into the left lower quadrant and 10 Fr drainage catheter into the right mid abdomen yielding at total of nearly 600 cc of purulent, foul smelling fluid.    Aspirated sample sent to the laboratory for analysis.    EBL: Minimal  Complications: None immediate  Adam RightJay Baily Serpe, MD Pager #: [phone]

## 2017-01-30 LAB — PREALBUMIN: Prealbumin: 5 mg/dL — ABNORMAL LOW (ref 18–38)

## 2017-01-30 LAB — BASIC METABOLIC PANEL
Anion gap: 9 (ref 5–15)
BUN: 15 mg/dL (ref 6–20)
CHLORIDE: 102 mmol/L (ref 101–111)
CO2: 23 mmol/L (ref 22–32)
CREATININE: 0.99 mg/dL (ref 0.61–1.24)
Calcium: 8 mg/dL — ABNORMAL LOW (ref 8.9–10.3)
GFR calc non Af Amer: >60 mL/min (ref >60)
Glucose, Bld: 113 mg/dL — ABNORMAL HIGH (ref 65–99)
POTASSIUM: 4.2 mmol/L (ref 3.5–5.1)
Sodium: 134 mmol/L — ABNORMAL LOW (ref 135–145)

## 2017-01-30 LAB — TRIGLYCERIDES: Triglycerides: 119 mg/dL (ref <150)

## 2017-01-30 LAB — CBC
HEMATOCRIT: 34.8 % — AB (ref 39.0–52.0)
HEMOGLOBIN: 11.8 g/dL — AB (ref 13.0–17.0)
MCH: 28.8 pg (ref 26.0–34.0)
MCHC: 33.9 g/dL (ref 30.0–36.0)
MCV: 84.9 fL (ref 78.0–100.0)
Platelets: 339 10*3/uL (ref 150–400)
RBC: 4.1 MIL/uL — ABNORMAL LOW (ref 4.22–5.81)
RDW: 14.2 % (ref 11.5–15.5)
WBC: 17.3 10*3/uL — ABNORMAL HIGH (ref 4.0–10.5)

## 2017-01-30 LAB — PHOSPHORUS: Phosphorus: 3.5 mg/dL (ref 2.5–4.6)

## 2017-01-30 LAB — GLUCOSE, CAPILLARY
GLUCOSE-CAPILLARY: 90 mg/dL (ref 65–99)
Glucose-Capillary: 128 mg/dL — ABNORMAL HIGH (ref 65–99)

## 2017-01-30 LAB — MAGNESIUM: Magnesium: 1.9 mg/dL (ref 1.7–2.4)

## 2017-01-30 MED ORDER — SODIUM CHLORIDE 0.9% FLUSH
10.0000 mL | INTRAVENOUS | Status: DC | PRN
  Administered 2017-01-30 – 2017-02-08 (×6): 10 mL
  Filled 2017-01-30 (×6): qty 40

## 2017-01-30 MED ORDER — SODIUM CHLORIDE 0.9% FLUSH
5.0000 mL | Freq: Three times a day (TID) | INTRAVENOUS | Status: DC
  Administered 2017-01-30 – 2017-02-12 (×28): 5 mL

## 2017-01-30 MED ORDER — KCL IN DEXTROSE-NACL 20-5-0.45 MEQ/L-%-% IV SOLN
INTRAVENOUS | Status: AC
  Administered 2017-01-30 – 2017-01-31 (×2): via INTRAVENOUS
  Filled 2017-01-30 (×3): qty 1000

## 2017-01-30 MED ORDER — FAT EMULSION 20 % IV EMUL
240.0000 mL | INTRAVENOUS | Status: AC
  Administered 2017-01-30: 240 mL via INTRAVENOUS
  Filled 2017-01-30: qty 250

## 2017-01-30 MED ORDER — INSULIN ASPART 100 UNIT/ML ~~LOC~~ SOLN
0.0000 [IU] | Freq: Three times a day (TID) | SUBCUTANEOUS | Status: DC
  Administered 2017-01-31 – 2017-02-03 (×7): 1 [IU] via SUBCUTANEOUS

## 2017-01-30 MED ORDER — SODIUM CHLORIDE 0.9% FLUSH
5.0000 mL | Freq: Three times a day (TID) | INTRAVENOUS | Status: DC
  Administered 2017-01-30 (×2): 5 mL via INTRAVENOUS

## 2017-01-30 MED ORDER — HEPARIN SODIUM (PORCINE) 5000 UNIT/ML IJ SOLN
5000.0000 [IU] | Freq: Three times a day (TID) | INTRAMUSCULAR | Status: DC
  Administered 2017-01-30 – 2017-02-05 (×18): 5000 [IU] via SUBCUTANEOUS
  Filled 2017-01-30 (×18): qty 1

## 2017-01-30 MED ORDER — TRACE MINERALS CR-CU-MN-SE-ZN 10-1000-500-60 MCG/ML IV SOLN
INTRAVENOUS | Status: AC
  Administered 2017-01-30: 18:00:00 via INTRAVENOUS
  Filled 2017-01-30: qty 960

## 2017-01-30 NOTE — Progress Notes (Signed)
[facility] Progress Note     Subjective: CC:  Reports sharp pains in right side of his back that are worse with inspiration. Denies chills. Denies nausea. Belching around NGT. Denies flatus or BM. Has not really ambulated in 24 hours.  S/P placement 2, 10 F drains yielding 600 cc purulent fluid yesterday.  TMAX 99.9 / 24h, tachycardic to 118 bpm   Objective: Vital signs in last 24 hours: Temp:  [99.9 F (37.7 C)-100.6 F (38.1 C)] 99.9 F (37.7 C) (09/12 0627) Pulse Rate:  [110-118] 115 (09/12 0627) Resp:  [20-28] 20 (09/12 0627) BP: (117-141)/(66-82) 128/81 (09/12 0627) SpO2:  [91 %-97 %] 91 % (09/12 0627) Last BM Date: 01/28/17  Intake/Output from previous day: 09/11 0701 - 09/12 0700 In: 3270 [I.V.:3000; IV Piggyback:260] Out: 2630 [Urine:980; Emesis/NG output:1500; Drains:150] Intake/Output this shift: No intake/output data recorded.  PE: Gen:  Alert, tachypneic, appears uncomfortable  Card:  Sinus tachycardia, pedal pulses 2+ BL Pulm: mildly labored respiratory effort, clear to auscultation bilaterally Abd: Soft, appropriately tender, moderately distended, absent BS, drain sites C/D/I with small amount purulent drainage.  R drain: 140 cc/24h   LLQ drain: 10 cc/24h Skin: warm and dry, no rashes  Psych: A&Ox3   Lab Results:   Recent Labs  01/28/17 1120 01/29/17 0452  WBC 21.8* 16.4*  HGB 14.6 12.1*  HCT 40.6 35.4*  PLT 275 291   BMET  Recent Labs  01/28/17 1120 01/29/17 0452  NA 130* 134*  K 3.7 4.1  CL 94* 102  CO2 23 23  GLUCOSE 127* 141*  BUN 24* 19  CREATININE 1.22 1.15  CALCIUM 9.3 8.1*   PT/INR  Recent Labs  01/29/17 0452  LABPROT 14.7  INR 1.15   CMP     Component Value Date/Time   NA 134 (L) 01/29/2017 0452   K 4.1 01/29/2017 0452   CL 102 01/29/2017 0452   CO2 23 01/29/2017 0452   GLUCOSE 141 (H) 01/29/2017 0452   BUN 19 01/29/2017 0452   CREATININE 1.15 01/29/2017 0452   CALCIUM 8.1 (L) 01/29/2017 0452    PROT 8.0 01/28/2017 1120   ALBUMIN 3.6 01/28/2017 1120   AST 31 01/28/2017 1120   ALT 34 01/28/2017 1120   ALKPHOS 79 01/28/2017 1120   BILITOT 1.8 (H) 01/28/2017 1120   GFRNONAA >60 01/29/2017 0452   GFRAA >60 01/29/2017 0452   Lipase     Component Value Date/Time   LIPASE 35 01/28/2017 1120       Studies/Results: Ct Abdomen Pelvis W Contrast  Result Date: 01/28/2017 CLINICAL DATA:  Abdominal pain with nausea and vomiting. EXAM: CT ABDOMEN AND PELVIS WITH CONTRAST TECHNIQUE: Multidetector CT imaging of the abdomen and pelvis was performed using the standard protocol following bolus administration of intravenous contrast. CONTRAST:  ISOVUE-300 IOPAMIDOL (ISOVUE-300) INJECTION 61% COMPARISON:  None. FINDINGS: Lower chest:  Trace right pleural effusion. Hepatobiliary: No focal liver abnormality is seen. No gallstones, gallbladder wall thickening, or biliary dilatation. Pancreas: Unremarkable. No pancreatic ductal dilatation or surrounding inflammatory changes. Spleen: Normal in size without focal abnormality. Adrenals/Urinary Tract: Adrenal glands are unremarkable. Kidneys are normal, without renal calculi, focal lesion, or hydronephrosis. Mild prominence of the right greater than left mid ureters is likely reactive. Bladder is unremarkable. Stomach/Bowel: The appendix is markedly dilated with surrounding periappendiceal inflammatory stranding and an appendicolith at the appendix base. There are multiple dilated loops of small bowel with air-fluid levels to the level of the distal ileum. A few loops  of proximal ileum remain nondilated. There is reactive wall thickening of the cecum and ascending colon, as well as the terminal ileum. The stomach is within normal limits. Vascular/Lymphatic: No significant vascular findings are present. No enlarged abdominal or pelvic lymph nodes. Multiple prominent mesenteric lymph nodes are likely reactive. Reproductive: Prostate is unremarkable. Other: There  is a loculated, rim enhancing fluid collection inferior to the liver, measuring approximately 11.0 x 7.3 x 19.1 cm, extending along the right paracolic gutter into the pelvis. There is an additional loculated fluid collection containing a few foci of air in the deep pelvis between the bladder and rectum measuring approximately 2.3 x 4.4 x 2.6 cm. This fluid collection extends superiorly along the bilateral paracolic gutters. The larger portion in the left lower pelvis measures approximately 6.6 x 5.1 x 5.4 cm. Musculoskeletal: No acute or significant osseous findings. IMPRESSION: 1. Acute, ruptured appendicitis with two large intra-abdominal abscesses, one inferior to the liver and tracking along the right paracolic gutter, and a second one in the deep pelvis extending along the left inferior paracolic gutter. 2. Small bowel obstruction to the level of the distal ileum, where there is a transition point that is likely related to the prominent mesenteric reactive changes. 3. Trace right pleural effusion. These results were called by telephone at the time of interpretation on 01/28/2017 at 1:11 pm to Dr. Shaune PollackAMERON ISAACS , who verbally acknowledged these results. Electronically Signed   By: Obie DredgeWilliam T Derry M.D.   On: 01/28/2017 13:13   Dg Abd Portable 1v  Result Date: 01/28/2017 CLINICAL DATA:  21 year old male with ruptured appendicitis. Nasogastric tube placement EXAM: PORTABLE ABDOMEN - 1 VIEW COMPARISON:  CT of the same day FINDINGS: Gastric tube terminates within the left upper quadrant. Gaseous distention of small bowel with relative decompression of the colon. Excreted contrast within the bilateral kidneys and ureters. IMPRESSION: Gastric tube terminates within the left upper quadrant. Gaseous distention of small bowel, either ileus or small-bowel obstruction. Excreted contrast within the bilateral collecting system and ureters. Electronically Signed   By: Gilmer MorJaime  Wagner D.O.   On: 01/28/2017 15:58   Ct Image  Guided Drainage By Percutaneous Catheter  Result Date: 01/29/2017 INDICATION: History of ruptured appendicitis, now with 2 intra-abdominal fluid collections. Please perform CT-guided drainage catheter placement for infection source control purposes. EXAM: CT IMAGE GUIDED DRAINAGE BY PERCUTANEOUS CATHETER x2 COMPARISON:  CT abdomen and pelvis - 01/28/2017 MEDICATIONS: The patient is currently admitted to the hospital and receiving intravenous antibiotics. The antibiotics were administered within an appropriate time frame prior to the initiation of the procedure. ANESTHESIA/SEDATION: Moderate (conscious) sedation was employed during this procedure. A total of Versed 3 mg and Fentanyl 100 mcg was administered intravenously. Moderate Sedation Time: 27 minutes. The patient's level of consciousness and vital signs were monitored continuously by radiology nursing throughout the procedure under my direct supervision. CONTRAST:  None COMPLICATIONS: None immediate. PROCEDURE: Informed written consent was obtained from the patient after a discussion of the risks, benefits and alternatives to treatment. The patient was placed supine on the CT gantry and a pre procedural CT was performed re-demonstrating the known abscess/fluid collection within the right mid hemiabdomen with dominant right lateral component measuring approximately 11.0 x 6.6 cm (image 5, series 2 and dominant collection within the left lower abdominal quadrant measuring approximately 6.4 x 4.3 cm (image 40, series 2). The procedure was planned. A timeout was performed prior to the initiation of the procedure. The skin overlying the right mid hemiabdomen as  well as the ventral aspect of the left lower abdomen/ pelvis were prepped and draped in the usual sterile fashion. Attention was first paid towards the left lower quadrant fluid collection. The overlying soft tissues were anesthetized with 1% lidocaine with epinephrine. Appropriate trajectory was planned  with the use of a 22 gauge spinal needle. An 18 gauge trocar needle was advanced into the abscess/fluid collection and a short Amplatz super stiff wire was coiled within the collection. Appropriate positioning was confirmed with a limited CT scan. The tract was serially dilated allowing placement of a 10 [place] all-purpose drainage catheter. Appropriate positioning was confirmed with a limited postprocedural CT scan. Attention was now paid towards the dominant collection with the right mid hemiabdomen. The overlying soft tissues were anesthetized with 1% lidocaine with epinephrine. Appropriate trajectory was planned with the use of a 22 gauge spinal needle. An 18 gauge trocar needle was advanced into the abscess/fluid collection and a short Amplatz super stiff wire was coiled within the collection. Appropriate positioning was confirmed with a limited CT scan. The tract was serially dilated allowing placement of a 10 [place] all-purpose drainage catheter. Appropriate positioning was confirmed with a limited postprocedural CT scan. A total approximately 600 cc of purulent, foul smelling fluid was aspirated from both collections. The tubes were connected to drainage bags and sutured in place. A dressing was placed. The patient tolerated the procedures well without immediate post procedural complication. IMPRESSION: Successful CT guided placement of a 10 French all purpose drain catheter into the dominant collections within the right mid hemiabdomen and left lower abdomen / pelvis yielding a total of approximately 600 cc of purulent, foul smelling fluid. Samples were sent to the laboratory as requested by the ordering clinical team. PLAN: - Recommend diligent recording of drainage catheter outputs. - Would have low threshold is to repeat a contrast-enhanced abdominal CT to evaluate for interval enlargement of residual ill-defined fluid collections within the lower pelvis, currently too small to warrant percutaneous  drainage. Electronically Signed   By: Simonne Come M.D.   On: 01/29/2017 14:31   Ct Image Guided Drainage By Percutaneous Catheter  Result Date: 01/29/2017 INDICATION: History of ruptured appendicitis, now with 2 intra-abdominal fluid collections. Please perform CT-guided drainage catheter placement for infection source control purposes. EXAM: CT IMAGE GUIDED DRAINAGE BY PERCUTANEOUS CATHETER x2 COMPARISON:  CT abdomen and pelvis - 01/28/2017 MEDICATIONS: The patient is currently admitted to the hospital and receiving intravenous antibiotics. The antibiotics were administered within an appropriate time frame prior to the initiation of the procedure. ANESTHESIA/SEDATION: Moderate (conscious) sedation was employed during this procedure. A total of Versed 3 mg and Fentanyl 100 mcg was administered intravenously. Moderate Sedation Time: 27 minutes. The patient's level of consciousness and vital signs were monitored continuously by radiology nursing throughout the procedure under my direct supervision. CONTRAST:  None COMPLICATIONS: None immediate. PROCEDURE: Informed written consent was obtained from the patient after a discussion of the risks, benefits and alternatives to treatment. The patient was placed supine on the CT gantry and a pre procedural CT was performed re-demonstrating the known abscess/fluid collection within the right mid hemiabdomen with dominant right lateral component measuring approximately 11.0 x 6.6 cm (image 5, series 2 and dominant collection within the left lower abdominal quadrant measuring approximately 6.4 x 4.3 cm (image 40, series 2). The procedure was planned. A timeout was performed prior to the initiation of the procedure. The skin overlying the right mid hemiabdomen as well as the ventral aspect of  the left lower abdomen/ pelvis were prepped and draped in the usual sterile fashion. Attention was first paid towards the left lower quadrant fluid collection. The overlying soft tissues  were anesthetized with 1% lidocaine with epinephrine. Appropriate trajectory was planned with the use of a 22 gauge spinal needle. An 18 gauge trocar needle was advanced into the abscess/fluid collection and a short Amplatz super stiff wire was coiled within the collection. Appropriate positioning was confirmed with a limited CT scan. The tract was serially dilated allowing placement of a 10 [place] all-purpose drainage catheter. Appropriate positioning was confirmed with a limited postprocedural CT scan. Attention was now paid towards the dominant collection with the right mid hemiabdomen. The overlying soft tissues were anesthetized with 1% lidocaine with epinephrine. Appropriate trajectory was planned with the use of a 22 gauge spinal needle. An 18 gauge trocar needle was advanced into the abscess/fluid collection and a short Amplatz super stiff wire was coiled within the collection. Appropriate positioning was confirmed with a limited CT scan. The tract was serially dilated allowing placement of a 10 [place] all-purpose drainage catheter. Appropriate positioning was confirmed with a limited postprocedural CT scan. A total approximately 600 cc of purulent, foul smelling fluid was aspirated from both collections. The tubes were connected to drainage bags and sutured in place. A dressing was placed. The patient tolerated the procedures well without immediate post procedural complication. IMPRESSION: Successful CT guided placement of a 10 French all purpose drain catheter into the dominant collections within the right mid hemiabdomen and left lower abdomen / pelvis yielding a total of approximately 600 cc of purulent, foul smelling fluid. Samples were sent to the laboratory as requested by the ordering clinical team. PLAN: - Recommend diligent recording of drainage catheter outputs. - Would have low threshold is to repeat a contrast-enhanced abdominal CT to evaluate for interval enlargement of residual ill-defined  fluid collections within the lower pelvis, currently too small to warrant percutaneous drainage. Electronically Signed   By: Simonne Come M.D.   On: 01/29/2017 14:31    Anti-infectives: Anti-infectives    Start     Dose/Rate Route Frequency Ordered Stop   01/28/17 2100  piperacillin-tazobactam (ZOSYN) IVPB 3.375 g     3.375 g 12.5 mL/hr over 240 Minutes Intravenous Every 8 hours 01/28/17 1333     01/28/17 1230  piperacillin-tazobactam (ZOSYN) IVPB 3.375 g     3.375 g 100 mL/hr over 30 Minutes Intravenous  Once 01/28/17 1219 01/29/17 0758     Assessment/Plan Acute appendicitis with rupture and intra-abdominal abscesses S/P perc drainage by Dr. Grace Isaac 9/11: 10 Fr drain LLQ, 10 Fr Drain R mid-abdomen >> 600cc purulent fluid. - follow CX; GS growing GNR, gram positive cocci - afebrile, WBC 17.3 today from 16.4 yesterday (not unexpected given recent procedure and significant intraabdominal contamination) - continue IV abx - mobilize, IS  - PRN analgesics and antiemetics  Expected paralytic ileus - NG tube 1,500 cc/24h; continue NGT and away further bowel function; my have some ice chips  FEN: NPO, IVF @ 125cc/hr; pt was not tolerating PO intake for 4 days prior to admission, now will ileus. Place PICC and start TNA.   ID: Zosyn 9/10 >> VTE: SCD's, start SQ heparin   Plan: patient is stable but is still at risk for need exploratory laparotomy. Continue close observation and IV abx. IV pain control. Clinical deterioration and sepsis will warrant surgery.      LOS: 2 days    Adam Phenix ,  [facility] 01/30/2017, 7:21 AM Pager: [phone] Consults: [phone] Mon-Fri 7:00 am-4:30 pm Sat-Sun 7:00 am-11:30 am

## 2017-01-30 NOTE — Progress Notes (Signed)
Peripherally Inserted Central Catheter/Midline Placement  The IV Nurse has discussed with the patient and/or persons authorized to consent for the patient, the purpose of this procedure and the potential benefits and risks involved with this procedure.  The benefits include less needle sticks, lab draws from the catheter, and the patient may be discharged home with the catheter. Risks include, but not limited to, infection, bleeding, blood clot (thrombus formation), and puncture of an artery; nerve damage and irregular heartbeat and possibility to perform a PICC exchange if needed/ordered by physician.  Alternatives to this procedure were also discussed.  Bard Power PICC patient education guide, fact sheet on infection prevention and patient information card has been provided to patient /or left at bedside.    PICC/Midline Placement Documentation        Maximino GreenlandLumban, Endya Austin Albarece 01/30/2017, 12:31 PM

## 2017-01-30 NOTE — Progress Notes (Signed)
PHARMACY - ADULT TOTAL PARENTERAL NUTRITION CONSULT NOTE   Pharmacy Consult for TPN Indication: enteral intolerance, paralytic ileus  Patient Measurements: Body mass index is 28.68 kg/m. Filed Weights   01/28/17 1102  Weight: 175 lb (79.4 kg)    HPI: 21 yoM presented to [facility] ED on 9/10 from [facility] with abdominal pain x4 days, bloating, belching, hiccups, sweats, N/V, unable to tolerate PO intake.  NG tube was placed on 9/10.  Found to have acute ruptured appendicitis and intra-abdominal abscesses.  S/p IR drainage on 9/11 with drains remaining in LLQ and R mid abdomen.  He remains at risk for needing exploratory laparotomy.  Pharmacy is consulted to dose dose TPN for enteral intolerance x6 days and paralytic ileus.  Significant events:  9/11 IR drainage of abscess  Insulin requirements past 24 hours: None   Current Nutrition: NPO  IVF: D5 0.45% NaCl with KCl 20 mEq/L at 125 ml/hr  Central access: PICC ordered 9/12 TPN start date:  9/12  ASSESSMENT                                                                                                          Today:   Glucose: in goal range 100-150 (No hx DM)  Electrolytes:  Na slightly low at 134, others WNL  Renal:  SCr 0.99  LFTs:  WNL except Tbili 1.8 (9/10)  TGs:  pending  Prealbumin:  pending  NG tube output , drain output total 150 mL   NUTRITIONAL GOALS                                                                                             RD recs (9/12):  110-127 Protein, 2225-2460 Kcal  Clinimix 5/20 at a goal rate of 83 ml/hr + 20% fat emulsion 240 ml/day to provide: 100 g of protein and 2233 kCals per day meeting 91% of protein and 100% of kCal needs. - Glucose infusion rate will be 3.5 mg/kg/min (max 5 mg/kg/min) - Limiting TPN rate to 83 ml/hr (one full 2L bag) in order to minimize waste due to ongoing national IVF and TPN shortages.  Meeting > 90% of estimated needs.   PLAN  At 1800 today:  Start Clinimix E 5/20 at 40 ml/hr.  20% fat emulsion at 20 ml/hr x12 hours/day  Plan to advance as tolerated to the goal rate.  TPN to contain standard multivitamins and trace element daily.  Reduce IVF to 85 ml/hr.  Add CBGs and sensitive SSI q8h.   TPN lab panels on Mondays & Thursdays.  Lynann Beaverhristine Rozann Holts PharmD, BCPS Pager [phone] 01/30/2017 10:57 AM

## 2017-01-30 NOTE — Plan of Care (Signed)
Problem: Activity: Goal: Risk for activity intolerance will decrease Outcome: Progressing Standing at side of bed, encouraging ambulation w/ staff  Problem: Nutrition: Goal: Adequate nutrition will be maintained Outcome: Progressing Starting TPN  Problem: Bowel/Gastric: Goal: Will not experience complications related to bowel motility Outcome: Progressing Patient verbalized understanding of POC

## 2017-01-30 NOTE — Progress Notes (Signed)
Referring Physician(s): Martin,M  Supervising Physician: Ruel FavorsShick, Trevor  Patient Status:  [facility]  Chief Complaint:  Appendiceal abscess  Subjective: Pt c/o some RUQ discomfort, esp with deep breathing; no N/V   Allergies: Patient has no known allergies.  Medications: Prior to Admission medications   Medication Sig Start Date End Date Taking? Authorizing Provider  ibuprofen (ADVIL,MOTRIN) 200 MG tablet Take 400 mg by mouth every 6 (six) hours as needed for moderate pain or cramping.   Yes [provider]     Vital Signs: BP 128/81   Pulse (!) 115   Temp 99.9 F (37.7 C) (Oral)   Resp 20   Ht 5' 5.5" (1.664 m)   Wt 175 lb (79.4 kg)   SpO2 91%   BMI 28.68 kg/m   Physical Exam  Awake/alert; sl tachy HR; abd dist; rt abd drain intact, site mild- mod tender, dressing dry, output 140 cc yellow fluid with debris; LLQ drain intact, dressing dry, output 10-15 cc yellow fluid ; both drains irrigated without difficulty Imaging: Ct Abdomen Pelvis W Contrast  Result Date: 01/28/2017 CLINICAL DATA:  Abdominal pain with nausea and vomiting. EXAM: CT ABDOMEN AND PELVIS WITH CONTRAST TECHNIQUE: Multidetector CT imaging of the abdomen and pelvis was performed using the standard protocol following bolus administration of intravenous contrast. CONTRAST:  100mL ISOVUE-300 IOPAMIDOL (ISOVUE-300) INJECTION 61% COMPARISON:  None. FINDINGS: Lower chest:  Trace right pleural effusion. Hepatobiliary: No focal liver abnormality is seen. No gallstones, gallbladder wall thickening, or biliary dilatation. Pancreas: Unremarkable. No pancreatic ductal dilatation or surrounding inflammatory changes. Spleen: Normal in size without focal abnormality. Adrenals/Urinary Tract: Adrenal glands are unremarkable. Kidneys are normal, without renal calculi, focal lesion, or hydronephrosis. Mild prominence of the right greater than left mid ureters is likely reactive. Bladder is unremarkable.  Stomach/Bowel: The appendix is markedly dilated with surrounding periappendiceal inflammatory stranding and an appendicolith at the appendix base. There are multiple dilated loops of small bowel with air-fluid levels to the level of the distal ileum. A few loops of proximal ileum remain nondilated. There is reactive wall thickening of the cecum and ascending colon, as well as the terminal ileum. The stomach is within normal limits. Vascular/Lymphatic: No significant vascular findings are present. No enlarged abdominal or pelvic lymph nodes. Multiple prominent mesenteric lymph nodes are likely reactive. Reproductive: Prostate is unremarkable. Other: There is a loculated, rim enhancing fluid collection inferior to the liver, measuring approximately 11.0 x 7.3 x 19.1 cm, extending along the right paracolic gutter into the pelvis. There is an additional loculated fluid collection containing a few foci of air in the deep pelvis between the bladder and rectum measuring approximately 2.3 x 4.4 x 2.6 cm. This fluid collection extends superiorly along the bilateral paracolic gutters. The larger portion in the left lower pelvis measures approximately 6.6 x 5.1 x 5.4 cm. Musculoskeletal: No acute or significant osseous findings. IMPRESSION: 1. Acute, ruptured appendicitis with two large intra-abdominal abscesses, one inferior to the liver and tracking along the right paracolic gutter, and a second one in the deep pelvis extending along the left inferior paracolic gutter. 2. Small bowel obstruction to the level of the distal ileum, where there is a transition point that is likely related to the prominent mesenteric reactive changes. 3. Trace right pleural effusion. These results were called by telephone at the time of interpretation on 01/28/2017 at 1:11 pm to Dr. Shaune PollackAMERON ISAACS , who verbally acknowledged these results. Electronically Signed   By: Teresita MaduraWilliam T  Derry M.D.   On: 01/28/2017 13:13   Dg Abd Portable 1v  Result  Date: 01/28/2017 CLINICAL DATA:  21 year old male with ruptured appendicitis. Nasogastric tube placement EXAM: PORTABLE ABDOMEN - 1 VIEW COMPARISON:  CT of the same day FINDINGS: Gastric tube terminates within the left upper quadrant. Gaseous distention of small bowel with relative decompression of the colon. Excreted contrast within the bilateral kidneys and ureters. IMPRESSION: Gastric tube terminates within the left upper quadrant. Gaseous distention of small bowel, either ileus or small-bowel obstruction. Excreted contrast within the bilateral collecting system and ureters. Electronically Signed   By: Gilmer Mor D.O.   On: 01/28/2017 15:58   Ct Image Guided Drainage By Percutaneous Catheter  Result Date: 01/29/2017 INDICATION: History of ruptured appendicitis, now with 2 intra-abdominal fluid collections. Please perform CT-guided drainage catheter placement for infection source control purposes. EXAM: CT IMAGE GUIDED DRAINAGE BY PERCUTANEOUS CATHETER x2 COMPARISON:  CT abdomen and pelvis - 01/28/2017 MEDICATIONS: The patient is currently admitted to the hospital and receiving intravenous antibiotics. The antibiotics were administered within an appropriate time frame prior to the initiation of the procedure. ANESTHESIA/SEDATION: Moderate (conscious) sedation was employed during this procedure. A total of Versed 3 mg and Fentanyl 100 mcg was administered intravenously. Moderate Sedation Time: 27 minutes. The patient's level of consciousness and vital signs were monitored continuously by radiology nursing throughout the procedure under my direct supervision. CONTRAST:  None COMPLICATIONS: None immediate. PROCEDURE: Informed written consent was obtained from the patient after a discussion of the risks, benefits and alternatives to treatment. The patient was placed supine on the CT gantry and a pre procedural CT was performed re-demonstrating the known abscess/fluid collection within the right mid hemiabdomen  with dominant right lateral component measuring approximately 11.0 x 6.6 cm (image 5, series 2 and dominant collection within the left lower abdominal quadrant measuring approximately 6.4 x 4.3 cm (image 40, series 2). The procedure was planned. A timeout was performed prior to the initiation of the procedure. The skin overlying the right mid hemiabdomen as well as the ventral aspect of the left lower abdomen/ pelvis were prepped and draped in the usual sterile fashion. Attention was first paid towards the left lower quadrant fluid collection. The overlying soft tissues were anesthetized with 1% lidocaine with epinephrine. Appropriate trajectory was planned with the use of a 22 gauge spinal needle. An 18 gauge trocar needle was advanced into the abscess/fluid collection and a short Amplatz super stiff wire was coiled within the collection. Appropriate positioning was confirmed with a limited CT scan. The tract was serially dilated allowing placement of a 10 [place] all-purpose drainage catheter. Appropriate positioning was confirmed with a limited postprocedural CT scan. Attention was now paid towards the dominant collection with the right mid hemiabdomen. The overlying soft tissues were anesthetized with 1% lidocaine with epinephrine. Appropriate trajectory was planned with the use of a 22 gauge spinal needle. An 18 gauge trocar needle was advanced into the abscess/fluid collection and a short Amplatz super stiff wire was coiled within the collection. Appropriate positioning was confirmed with a limited CT scan. The tract was serially dilated allowing placement of a 10 [place] all-purpose drainage catheter. Appropriate positioning was confirmed with a limited postprocedural CT scan. A total approximately 600 cc of purulent, foul smelling fluid was aspirated from both collections. The tubes were connected to drainage bags and sutured in place. A dressing was placed. The patient tolerated the procedures well without  immediate post procedural complication. IMPRESSION: Successful CT  guided placement of a 10 [place] all purpose drain catheter into the dominant collections within the right mid hemiabdomen and left lower abdomen / pelvis yielding a total of approximately 600 cc of purulent, foul smelling fluid. Samples were sent to the laboratory as requested by the ordering clinical team. PLAN: - Recommend diligent recording of drainage catheter outputs. - Would have low threshold is to repeat a contrast-enhanced abdominal CT to evaluate for interval enlargement of residual ill-defined fluid collections within the lower pelvis, currently too small to warrant percutaneous drainage. Electronically Signed   By: Simonne Come M.D.   On: 01/29/2017 14:31   Ct Image Guided Drainage By Percutaneous Catheter  Result Date: 01/29/2017 INDICATION: History of ruptured appendicitis, now with 2 intra-abdominal fluid collections. Please perform CT-guided drainage catheter placement for infection source control purposes. EXAM: CT IMAGE GUIDED DRAINAGE BY PERCUTANEOUS CATHETER x2 COMPARISON:  CT abdomen and pelvis - 01/28/2017 MEDICATIONS: The patient is currently admitted to the hospital and receiving intravenous antibiotics. The antibiotics were administered within an appropriate time frame prior to the initiation of the procedure. ANESTHESIA/SEDATION: Moderate (conscious) sedation was employed during this procedure. A total of Versed 3 mg and Fentanyl 100 mcg was administered intravenously. Moderate Sedation Time: 27 minutes. The patient's level of consciousness and vital signs were monitored continuously by radiology nursing throughout the procedure under my direct supervision. CONTRAST:  None COMPLICATIONS: None immediate. PROCEDURE: Informed written consent was obtained from the patient after a discussion of the risks, benefits and alternatives to treatment. The patient was placed supine on the CT gantry and a pre procedural CT was performed  re-demonstrating the known abscess/fluid collection within the right mid hemiabdomen with dominant right lateral component measuring approximately 11.0 x 6.6 cm (image 5, series 2 and dominant collection within the left lower abdominal quadrant measuring approximately 6.4 x 4.3 cm (image 40, series 2). The procedure was planned. A timeout was performed prior to the initiation of the procedure. The skin overlying the right mid hemiabdomen as well as the ventral aspect of the left lower abdomen/ pelvis were prepped and draped in the usual sterile fashion. Attention was first paid towards the left lower quadrant fluid collection. The overlying soft tissues were anesthetized with 1% lidocaine with epinephrine. Appropriate trajectory was planned with the use of a 22 gauge spinal needle. An 18 gauge trocar needle was advanced into the abscess/fluid collection and a short Amplatz super stiff wire was coiled within the collection. Appropriate positioning was confirmed with a limited CT scan. The tract was serially dilated allowing placement of a 10 [place] all-purpose drainage catheter. Appropriate positioning was confirmed with a limited postprocedural CT scan. Attention was now paid towards the dominant collection with the right mid hemiabdomen. The overlying soft tissues were anesthetized with 1% lidocaine with epinephrine. Appropriate trajectory was planned with the use of a 22 gauge spinal needle. An 18 gauge trocar needle was advanced into the abscess/fluid collection and a short Amplatz super stiff wire was coiled within the collection. Appropriate positioning was confirmed with a limited CT scan. The tract was serially dilated allowing placement of a 10 [place] all-purpose drainage catheter. Appropriate positioning was confirmed with a limited postprocedural CT scan. A total approximately 600 cc of purulent, foul smelling fluid was aspirated from both collections. The tubes were connected to drainage bags and sutured  in place. A dressing was placed. The patient tolerated the procedures well without immediate post procedural complication. IMPRESSION: Successful CT guided placement of a 10 [place]  all purpose drain catheter into the dominant collections within the right mid hemiabdomen and left lower abdomen / pelvis yielding a total of approximately 600 cc of purulent, foul smelling fluid. Samples were sent to the laboratory as requested by the ordering clinical team. PLAN: - Recommend diligent recording of drainage catheter outputs. - Would have low threshold is to repeat a contrast-enhanced abdominal CT to evaluate for interval enlargement of residual ill-defined fluid collections within the lower pelvis, currently too small to warrant percutaneous drainage. Electronically Signed   By: Simonne Come M.D.   On: 01/29/2017 14:31    Labs:  CBC:  Recent Labs  01/28/17 1120 01/29/17 0452 01/30/17 0848  WBC 21.8* 16.4* 17.3*  HGB 14.6 12.1* 11.8*  HCT 40.6 35.4* 34.8*  PLT 275 291 339    COAGS:  Recent Labs  01/29/17 0452  INR 1.15    BMP:  Recent Labs  01/28/17 1120 01/29/17 0452 01/30/17 0848  NA 130* 134* 134*  K 3.7 4.1 4.2  CL 94* 102 102  CO2 GLUCOSE 127* 141* 113*  BUN 24* 19 15  CALCIUM 9.3 8.1* 8.0*  CREATININE 1.22 1.15 0.99  GFRNONAA >60 >60 >60  GFRAA >60 >60 >60    LIVER FUNCTION TESTS:  Recent Labs  01/28/17 1120  BILITOT 1.8*  AST 31  ALT 34  ALKPHOS 79  PROT 8.0  ALBUMIN 3.6    Assessment and Plan: Pt with hx ruptured appendicitis/intra abd abscesses; s/p drains x2 9/11; temp 99.9, WBC 17.3(16.4),hgb 11.8(12.1), creat 0.99; fluid cx's pend (gm + cocci/gm - rods); monitor labs/vitals closely for any acute changes; check final cx's; cont drain irrigation; low threshold for operative intervention   Electronically Signed: D. Jeananne Rama, PA-C 01/30/2017, 10:40 AM   I spent a total of 15 minutes  at the the patient's bedside AND on the patient's  hospital floor or unit, greater than 50% of which was counseling/coordinating care for abdominal abscess drains    Patient ID: Adam Mcdowell, male   DOB: 08-06-1995, 21 y.o.   MRN: [id number]

## 2017-01-30 NOTE — Progress Notes (Signed)
Initial Nutrition Assessment  DOCUMENTATION CODES:   Not applicable  INTERVENTION:  - TPN per Pharmacy. - RD will perform nutrition-focused physical exam and obtain PTA information at follow-up.   NUTRITION DIAGNOSIS:   Inadequate oral intake related to inability to eat as evidenced by NPO status.  GOAL:   Patient will meet greater than or equal to 90% of their needs  MONITOR:   Diet advancement, Weight trends, Labs, I & O's, Other (Comment) (TPN regimen)  REASON FOR ASSESSMENT:   Consult New TPN/TNA  ASSESSMENT:   21 year old white male with no previous past medical history who [facility] with a chief complaint of 4 days of abdominal pain. The patient reports right lower quadrant abdominal pain that started 9/7. Pain described as sharp, constant right lower quadrant pain with intermittent radiation to central lower abdomen. Denies aggravating or alleviating factors. Associated symptoms include abdominal bloating, belching, hiccups, cold sweats, nausea, and vomiting. The patient reports a small loose bowel movement this morning but states bowel function has been irregular. He initially thought he had the flu and attempted to take a cold and flu medication that he got over-the-counter with little relief of symptoms. Due to persistent pain and intolerance of oral intake he presented to the emergency department for evaluation.  BMI indicates normal weight status. PTA information outlined above from H&P. NGT was placed in the ED on 9/10 and Surgery PA note from this AM indicated 1500cc out over 24 hours. Plan to continue NGT although pt is allowed ice chips and sips of water with medications. PICC being placed at this time; unable to talk with pt or obtain PTA information from him or family.  Physical assessment unable to be performed at this time. Per Care Everywhere review, pt weighed 170 lbs on 11/29/15.    Recommendations for TPN goal rate: Clinimix E 5/15 @ 100 mL/hr with 20% ILE @ 20 mL/hr x12 hours. This regimen will provide 2184 kcal (98% minimum estimated kcal need) and 120 grams of protein.   Medications reviewed; sliding scale Novolog. Labs reviewed; Na: 134 mmol/L, Ca: 8 mg/dL.  IVF: D5-1/2 NS-20 mEq KCl @ 125 mL/hr (510 kcal from dextrose).    Diet Order:  Diet NPO time specified Except for: Sips with Meds  Skin:  Reviewed, no issues  Last BM:  9/10  Height:   Ht Readings from Last 1 Encounters:  01/28/17 5' 5.5" (1.664 m)    Weight:   Wt Readings from Last 1 Encounters:  01/28/17 175 lb (79.4 kg)    Ideal Body Weight:  63.18 kg  BMI:  Body mass index is 28.68 kg/m.  Estimated Nutritional Needs:   Kcal:  2225-2460 (28-31 kcal/kg)  Protein:  110-127 grams (1.4-1.6 grams/kg)  Fluid:  >/= 2.2 L/day  EDUCATION NEEDS:   No education needs identified at this time    Trenton GammonJessica Mykale Gandolfo, MS, RD, LDN, CNSC Inpatient Clinical Dietitian Pager # [phone] After hours/weekend pager # [phone]

## 2017-01-31 ENCOUNTER — Encounter (HOSPITAL_COMMUNITY): Payer: Self-pay | Admitting: Radiology

## 2017-01-31 ENCOUNTER — Inpatient Hospital Stay (HOSPITAL_COMMUNITY): Payer: BLUE CROSS/BLUE SHIELD

## 2017-01-31 LAB — COMPREHENSIVE METABOLIC PANEL
ALK PHOS: 69 U/L (ref 38–126)
ALT: 45 U/L (ref 17–63)
AST: 41 U/L (ref 15–41)
Albumin: 2.3 g/dL — ABNORMAL LOW (ref 3.5–5.0)
Anion gap: 7 (ref 5–15)
BUN: 12 mg/dL (ref 6–20)
CALCIUM: 7.8 mg/dL — AB (ref 8.9–10.3)
CHLORIDE: 101 mmol/L (ref 101–111)
CO2: 26 mmol/L (ref 22–32)
CREATININE: 0.89 mg/dL (ref 0.61–1.24)
Glucose, Bld: 134 mg/dL — ABNORMAL HIGH (ref 65–99)
Potassium: 3.7 mmol/L (ref 3.5–5.1)
Sodium: 134 mmol/L — ABNORMAL LOW (ref 135–145)
Total Bilirubin: 0.8 mg/dL (ref 0.3–1.2)
Total Protein: 5.8 g/dL — ABNORMAL LOW (ref 6.5–8.1)

## 2017-01-31 LAB — DIFFERENTIAL
BASOS PCT: 0 %
Basophils Absolute: 0 10*3/uL (ref 0.0–0.1)
EOS ABS: 0.2 10*3/uL (ref 0.0–0.7)
Eosinophils Relative: 1 %
LYMPHS PCT: 11 %
Lymphs Abs: 2.1 10*3/uL (ref 0.7–4.0)
MONO ABS: 1.1 10*3/uL — AB (ref 0.1–1.0)
Monocytes Relative: 6 %
NEUTROS ABS: 15.6 10*3/uL — AB (ref 1.7–7.7)
NEUTROS PCT: 82 %

## 2017-01-31 LAB — CBC
HCT: 32 % — ABNORMAL LOW (ref 39.0–52.0)
Hemoglobin: 10.9 g/dL — ABNORMAL LOW (ref 13.0–17.0)
MCH: 28.8 pg (ref 26.0–34.0)
MCHC: 34.1 g/dL (ref 30.0–36.0)
MCV: 84.7 fL (ref 78.0–100.0)
PLATELETS: 413 10*3/uL — AB (ref 150–400)
RBC: 3.78 MIL/uL — AB (ref 4.22–5.81)
RDW: 14 % (ref 11.5–15.5)
WBC: 19 10*3/uL — AB (ref 4.0–10.5)

## 2017-01-31 LAB — MAGNESIUM: MAGNESIUM: 2.1 mg/dL (ref 1.7–2.4)

## 2017-01-31 LAB — GLUCOSE, CAPILLARY
GLUCOSE-CAPILLARY: 101 mg/dL — AB (ref 65–99)
Glucose-Capillary: 109 mg/dL — ABNORMAL HIGH (ref 65–99)
Glucose-Capillary: 134 mg/dL — ABNORMAL HIGH (ref 65–99)

## 2017-01-31 LAB — PHOSPHORUS: Phosphorus: 4.2 mg/dL (ref 2.5–4.6)

## 2017-01-31 MED ORDER — MIDAZOLAM HCL 2 MG/2ML IJ SOLN
INTRAMUSCULAR | Status: AC
  Filled 2017-01-31: qty 6

## 2017-01-31 MED ORDER — MIDAZOLAM HCL 2 MG/2ML IJ SOLN
INTRAMUSCULAR | Status: AC | PRN
  Administered 2017-01-31 (×2): 1 mg via INTRAVENOUS

## 2017-01-31 MED ORDER — IOPAMIDOL (ISOVUE-300) INJECTION 61%
INTRAVENOUS | Status: AC
  Filled 2017-01-31: qty 30

## 2017-01-31 MED ORDER — PIPERACILLIN-TAZOBACTAM 3.375 G IVPB
3.3750 g | Freq: Three times a day (TID) | INTRAVENOUS | Status: DC
  Administered 2017-01-31 – 2017-02-04 (×13): 3.375 g via INTRAVENOUS
  Filled 2017-01-31 (×14): qty 50

## 2017-01-31 MED ORDER — SODIUM CHLORIDE 0.9 % IV SOLN
INTRAVENOUS | Status: AC
  Administered 2017-01-31 – 2017-02-01 (×2): 65 mL/h via INTRAVENOUS

## 2017-01-31 MED ORDER — IOPAMIDOL (ISOVUE-300) INJECTION 61%: 15.0000 mL | Freq: Once | INTRAVENOUS | Status: DC | PRN

## 2017-01-31 MED ORDER — SODIUM CHLORIDE 0.9% FLUSH
5.0000 mL | Freq: Three times a day (TID) | INTRAVENOUS | Status: DC
  Administered 2017-01-31 – 2017-02-11 (×19): 5 mL via INTRAVENOUS

## 2017-01-31 MED ORDER — FENTANYL CITRATE (PF) 100 MCG/2ML IJ SOLN
INTRAMUSCULAR | Status: AC
  Filled 2017-01-31: qty 4

## 2017-01-31 MED ORDER — FAT EMULSION 20 % IV EMUL
240.0000 mL | INTRAVENOUS | Status: AC
  Administered 2017-01-31: 240 mL via INTRAVENOUS
  Filled 2017-01-31: qty 250

## 2017-01-31 MED ORDER — LIDOCAINE HCL (PF) 1 % IJ SOLN
INTRAMUSCULAR | Status: AC | PRN
  Administered 2017-01-31: 10 mL via INTRADERMAL

## 2017-01-31 MED ORDER — TRACE MINERALS CR-CU-MN-SE-ZN 10-1000-500-60 MCG/ML IV SOLN
INTRAVENOUS | Status: AC
  Administered 2017-01-31: 17:00:00 via INTRAVENOUS
  Filled 2017-01-31: qty 1440

## 2017-01-31 MED ORDER — ONDANSETRON HCL 4 MG/2ML IJ SOLN
INTRAMUSCULAR | Status: AC
  Administered 2017-02-01: 13:00:00 4 mg via INTRAVENOUS
  Filled 2017-01-31: qty 2

## 2017-01-31 MED ORDER — IOPAMIDOL (ISOVUE-300) INJECTION 61%
INTRAVENOUS | Status: AC
  Administered 2017-01-31: 100 mL
  Filled 2017-01-31: qty 100

## 2017-01-31 MED ORDER — FENTANYL CITRATE (PF) 100 MCG/2ML IJ SOLN
INTRAMUSCULAR | Status: AC | PRN
  Administered 2017-01-31 (×3): 50 ug via INTRAVENOUS

## 2017-01-31 MED ORDER — LIP MEDEX EX OINT
TOPICAL_OINTMENT | CUTANEOUS | Status: AC
  Administered 2017-01-31: 1
  Filled 2017-01-31: qty 7

## 2017-01-31 MED ORDER — HYDROMORPHONE HCL 1 MG/ML IJ SOLN
INTRAMUSCULAR | Status: AC
  Administered 2017-01-31: 1 mg
  Filled 2017-01-31: qty 1

## 2017-01-31 NOTE — Sedation Documentation (Signed)
Pt experiencing pain with drain placement

## 2017-01-31 NOTE — Sedation Documentation (Signed)
Patient denies pain and is resting comfortably.  

## 2017-01-31 NOTE — Progress Notes (Signed)
Referring Physician(s): Martin,M  Supervising Physician: Jolaine Click  Patient Status:  [facility] - In-pt  Chief Complaint:  Appendiceal abscess  Subjective: Pt feeling about the same this am; has had BM since testerday; some occ nausea, no vomiting ; NG in place; still with abd distension and some RUQ discomfort with deep inspiration   Allergies: Patient has no known allergies.  Medications: Prior to Admission medications   Medication Sig Start Date End Date Taking? Authorizing Provider  ibuprofen (ADVIL,MOTRIN) 200 MG tablet Take 400 mg by mouth every 6 (six) hours as needed for moderate pain or cramping.   Yes [provider]     Vital Signs: BP 131/65 (BP Location: Left Arm)   Pulse (!) 106   Temp 98.9 F (37.2 C) (Oral)   Resp 19   Ht 5' 5.5" (1.664 m)   Wt 192 lb 3.9 oz (87.2 kg)   SpO2 98%   BMI 31.50 kg/m   Physical Exam awake/alert; R/L abd drains intact, outputs 95/125 cc respectively yellow fluid; abd dist, few BS; sl more tender rt abd region  Imaging: Ct Abdomen Pelvis W Contrast  Result Date: 01/28/2017 CLINICAL DATA:  Abdominal pain with nausea and vomiting. EXAM: CT ABDOMEN AND PELVIS WITH CONTRAST TECHNIQUE: Multidetector CT imaging of the abdomen and pelvis was performed using the standard protocol following bolus administration of intravenous contrast. CONTRAST:  ISOVUE-300 IOPAMIDOL (ISOVUE-300) INJECTION 61% COMPARISON:  None. FINDINGS: Lower chest:  Trace right pleural effusion. Hepatobiliary: No focal liver abnormality is seen. No gallstones, gallbladder wall thickening, or biliary dilatation. Pancreas: Unremarkable. No pancreatic ductal dilatation or surrounding inflammatory changes. Spleen: Normal in size without focal abnormality. Adrenals/Urinary Tract: Adrenal glands are unremarkable. Kidneys are normal, without renal calculi, focal lesion, or hydronephrosis. Mild prominence of the right greater than left mid ureters is likely  reactive. Bladder is unremarkable. Stomach/Bowel: The appendix is markedly dilated with surrounding periappendiceal inflammatory stranding and an appendicolith at the appendix base. There are multiple dilated loops of small bowel with air-fluid levels to the level of the distal ileum. A few loops of proximal ileum remain nondilated. There is reactive wall thickening of the cecum and ascending colon, as well as the terminal ileum. The stomach is within normal limits. Vascular/Lymphatic: No significant vascular findings are present. No enlarged abdominal or pelvic lymph nodes. Multiple prominent mesenteric lymph nodes are likely reactive. Reproductive: Prostate is unremarkable. Other: There is a loculated, rim enhancing fluid collection inferior to the liver, measuring approximately 11.0 x 7.3 x 19.1 cm, extending along the right paracolic gutter into the pelvis. There is an additional loculated fluid collection containing a few foci of air in the deep pelvis between the bladder and rectum measuring approximately 2.3 x 4.4 x 2.6 cm. This fluid collection extends superiorly along the bilateral paracolic gutters. The larger portion in the left lower pelvis measures approximately 6.6 x 5.1 x 5.4 cm. Musculoskeletal: No acute or significant osseous findings. IMPRESSION: 1. Acute, ruptured appendicitis with two large intra-abdominal abscesses, one inferior to the liver and tracking along the right paracolic gutter, and a second one in the deep pelvis extending along the left inferior paracolic gutter. 2. Small bowel obstruction to the level of the distal ileum, where there is a transition point that is likely related to the prominent mesenteric reactive changes. 3. Trace right pleural effusion. These results were called by telephone at the time of interpretation on 01/28/2017 at 1:11 pm to Dr. Shaune Pollack , who verbally acknowledged  these results. Electronically Signed   By: Obie DredgeWilliam T Derry M.D.   On: 01/28/2017 13:13    Dg Abd Portable 1v  Result Date: 01/28/2017 CLINICAL DATA:  21 year old male with ruptured appendicitis. Nasogastric tube placement EXAM: PORTABLE ABDOMEN - 1 VIEW COMPARISON:  CT of the same day FINDINGS: Gastric tube terminates within the left upper quadrant. Gaseous distention of small bowel with relative decompression of the colon. Excreted contrast within the bilateral kidneys and ureters. IMPRESSION: Gastric tube terminates within the left upper quadrant. Gaseous distention of small bowel, either ileus or small-bowel obstruction. Excreted contrast within the bilateral collecting system and ureters. Electronically Signed   By: Gilmer MorJaime  Wagner D.O.   On: 01/28/2017 15:58   Ct Image Guided Drainage By Percutaneous Catheter  Result Date: 01/29/2017 INDICATION: History of ruptured appendicitis, now with 2 intra-abdominal fluid collections. Please perform CT-guided drainage catheter placement for infection source control purposes. EXAM: CT IMAGE GUIDED DRAINAGE BY PERCUTANEOUS CATHETER x2 COMPARISON:  CT abdomen and pelvis - 01/28/2017 MEDICATIONS: The patient is currently admitted to the hospital and receiving intravenous antibiotics. The antibiotics were administered within an appropriate time frame prior to the initiation of the procedure. ANESTHESIA/SEDATION: Moderate (conscious) sedation was employed during this procedure. A total of Versed 3 mg and Fentanyl 100 mcg was administered intravenously. Moderate Sedation Time: 27 minutes. The patient's level of consciousness and vital signs were monitored continuously by radiology nursing throughout the procedure under my direct supervision. CONTRAST:  None COMPLICATIONS: None immediate. PROCEDURE: Informed written consent was obtained from the patient after a discussion of the risks, benefits and alternatives to treatment. The patient was placed supine on the CT gantry and a pre procedural CT was performed re-demonstrating the known abscess/fluid collection  within the right mid hemiabdomen with dominant right lateral component measuring approximately 11.0 x 6.6 cm (image 5, series 2 and dominant collection within the left lower abdominal quadrant measuring approximately 6.4 x 4.3 cm (image 40, series 2). The procedure was planned. A timeout was performed prior to the initiation of the procedure. The skin overlying the right mid hemiabdomen as well as the ventral aspect of the left lower abdomen/ pelvis were prepped and draped in the usual sterile fashion. Attention was first paid towards the left lower quadrant fluid collection. The overlying soft tissues were anesthetized with 1% lidocaine with epinephrine. Appropriate trajectory was planned with the use of a 22 gauge spinal needle. An 18 gauge trocar needle was advanced into the abscess/fluid collection and a short Amplatz super stiff wire was coiled within the collection. Appropriate positioning was confirmed with a limited CT scan. The tract was serially dilated allowing placement of a 10 [place] all-purpose drainage catheter. Appropriate positioning was confirmed with a limited postprocedural CT scan. Attention was now paid towards the dominant collection with the right mid hemiabdomen. The overlying soft tissues were anesthetized with 1% lidocaine with epinephrine. Appropriate trajectory was planned with the use of a 22 gauge spinal needle. An 18 gauge trocar needle was advanced into the abscess/fluid collection and a short Amplatz super stiff wire was coiled within the collection. Appropriate positioning was confirmed with a limited CT scan. The tract was serially dilated allowing placement of a 10 [place] all-purpose drainage catheter. Appropriate positioning was confirmed with a limited postprocedural CT scan. A total approximately 600 cc of purulent, foul smelling fluid was aspirated from both collections. The tubes were connected to drainage bags and sutured in place. A dressing was placed. The patient  tolerated the procedures  well without immediate post procedural complication. IMPRESSION: Successful CT guided placement of a 10 French all purpose drain catheter into the dominant collections within the right mid hemiabdomen and left lower abdomen / pelvis yielding a total of approximately 600 cc of purulent, foul smelling fluid. Samples were sent to the laboratory as requested by the ordering clinical team. PLAN: - Recommend diligent recording of drainage catheter outputs. - Would have low threshold is to repeat a contrast-enhanced abdominal CT to evaluate for interval enlargement of residual ill-defined fluid collections within the lower pelvis, currently too small to warrant percutaneous drainage. Electronically Signed   By: Simonne Come M.D.   On: 01/29/2017 14:31   Ct Image Guided Drainage By Percutaneous Catheter  Result Date: 01/29/2017 INDICATION: History of ruptured appendicitis, now with 2 intra-abdominal fluid collections. Please perform CT-guided drainage catheter placement for infection source control purposes. EXAM: CT IMAGE GUIDED DRAINAGE BY PERCUTANEOUS CATHETER x2 COMPARISON:  CT abdomen and pelvis - 01/28/2017 MEDICATIONS: The patient is currently admitted to the hospital and receiving intravenous antibiotics. The antibiotics were administered within an appropriate time frame prior to the initiation of the procedure. ANESTHESIA/SEDATION: Moderate (conscious) sedation was employed during this procedure. A total of Versed 3 mg and Fentanyl 100 mcg was administered intravenously. Moderate Sedation Time: 27 minutes. The patient's level of consciousness and vital signs were monitored continuously by radiology nursing throughout the procedure under my direct supervision. CONTRAST:  None COMPLICATIONS: None immediate. PROCEDURE: Informed written consent was obtained from the patient after a discussion of the risks, benefits and alternatives to treatment. The patient was placed supine on the CT gantry  and a pre procedural CT was performed re-demonstrating the known abscess/fluid collection within the right mid hemiabdomen with dominant right lateral component measuring approximately 11.0 x 6.6 cm (image 5, series 2 and dominant collection within the left lower abdominal quadrant measuring approximately 6.4 x 4.3 cm (image 40, series 2). The procedure was planned. A timeout was performed prior to the initiation of the procedure. The skin overlying the right mid hemiabdomen as well as the ventral aspect of the left lower abdomen/ pelvis were prepped and draped in the usual sterile fashion. Attention was first paid towards the left lower quadrant fluid collection. The overlying soft tissues were anesthetized with 1% lidocaine with epinephrine. Appropriate trajectory was planned with the use of a 22 gauge spinal needle. An 18 gauge trocar needle was advanced into the abscess/fluid collection and a short Amplatz super stiff wire was coiled within the collection. Appropriate positioning was confirmed with a limited CT scan. The tract was serially dilated allowing placement of a 10 [place] all-purpose drainage catheter. Appropriate positioning was confirmed with a limited postprocedural CT scan. Attention was now paid towards the dominant collection with the right mid hemiabdomen. The overlying soft tissues were anesthetized with 1% lidocaine with epinephrine. Appropriate trajectory was planned with the use of a 22 gauge spinal needle. An 18 gauge trocar needle was advanced into the abscess/fluid collection and a short Amplatz super stiff wire was coiled within the collection. Appropriate positioning was confirmed with a limited CT scan. The tract was serially dilated allowing placement of a 10 [place] all-purpose drainage catheter. Appropriate positioning was confirmed with a limited postprocedural CT scan. A total approximately 600 cc of purulent, foul smelling fluid was aspirated from both collections. The tubes were  connected to drainage bags and sutured in place. A dressing was placed. The patient tolerated the procedures well without immediate post procedural complication.  IMPRESSION: Successful CT guided placement of a 10 French all purpose drain catheter into the dominant collections within the right mid hemiabdomen and left lower abdomen / pelvis yielding a total of approximately 600 cc of purulent, foul smelling fluid. Samples were sent to the laboratory as requested by the ordering clinical team. PLAN: - Recommend diligent recording of drainage catheter outputs. - Would have low threshold is to repeat a contrast-enhanced abdominal CT to evaluate for interval enlargement of residual ill-defined fluid collections within the lower pelvis, currently too small to warrant percutaneous drainage. Electronically Signed   By: Simonne Come M.D.   On: 01/29/2017 14:31    Labs:  CBC:  Recent Labs  01/28/17 1120 01/29/17 0452 01/30/17 0848 01/31/17 0459  WBC 21.8* 16.4* 17.3* 19.0*  HGB 14.6 12.1* 11.8* 10.9*  HCT 40.6 35.4* 34.8* 32.0*  PLT 275 291 339 413*    COAGS:  Recent Labs  01/29/17 0452  INR 1.15    BMP:  Recent Labs  01/28/17 1120 01/29/17 0452 01/30/17 0848 01/31/17 0459  NA 130* 134* 134* 134*  K 3.7 4.1 4.2 3.7  CL 94* 102 102 101  CO2 GLUCOSE 127* 141* 113* 134*  BUN 24* CALCIUM 9.3 8.1* 8.0* 7.8*  CREATININE 1.22 1.15 0.99 0.89  GFRNONAA >60 >60 >60 >60  GFRAA >60 >60 >60 >60    LIVER FUNCTION TESTS:  Recent Labs  01/28/17 1120 01/31/17 0459  BILITOT 1.8* 0.8  AST 31 41  ALT 34 45  ALKPHOS 79 69  PROT 8.0 5.8*  ALBUMIN 3.6 2.3*    Assessment and Plan: Pt with hx ruptured appendicitis/intra abd abscesses; s/p drains x2 9/11;afebrile; WBC up slightly to 19(17.3), hgb 10.9(11.8), creat 0.89; drain fluid cx pend; plan today is for repeat CT A/P with possible aspiration/drainage of remaining deep pelvic abscess if necessary; above d/w pt with  his understanding and consent.    Electronically Signed: D. Jeananne Rama, PA-C 01/31/2017, 9:57 AM   I spent a total of 20 minutes at the the patient's bedside AND on the patient's hospital floor or unit, greater than 50% of which was counseling/coordinating care for abdominal/pelvic abscess drains    Patient ID: Adam Mcdowell, male   DOB: [birth year], 21 y.o.   MRN: [id number]

## 2017-01-31 NOTE — Procedures (Signed)
R transgluteal abscess drain Pus EBL 0 Comp 0

## 2017-01-31 NOTE — Progress Notes (Signed)
PHARMACY - ADULT TOTAL PARENTERAL NUTRITION CONSULT NOTE   Pharmacy Consult for TPN Indication: enteral intolerance, paralytic ileus  Patient Measurements: Body mass index is 31.5 kg/m. Filed Weights   01/28/17 1102 01/30/17 1306  Weight: 175 lb (79.4 kg) 192 lb 3.9 oz (87.2 kg)    HPI: 21 [facility] abdominal pain x4 days, bloating, belching, hiccups, sweats, N/V, unable to tolerate PO intake.  NG tube was placed on 9/10.  Found to have acute ruptured appendicitis and intra-abdominal abscesses.  S/p IR drainage on 9/11 with drains remaining in LLQ and R mid abdomen.  He remains at risk for needing exploratory laparotomy.  Pharmacy is consulted to dose dose TPN for enteral intolerance x6 days and paralytic ileus.  Significant events:  9/13 repeat CT pending  Insulin requirements past 24 hours: None   Current Nutrition: NPO  IVF: D5 0.45% NaCl with KCl 20 mEq/L at 85 ml/hr - Change to 0.9% NaCl at 65 ml/hr on 9/13  Central access: PICC placed 9/12 TPN start date:  9/12  ASSESSMENT                                                                                                          Today:   Glucose: in goal range 100-150 (No hx DM)  Electrolytes:  Na remains slightly low at 134 (unable to adjust in premix TPN, will change maintenance IVF), others WNL including CorrCa 9.16  Renal:  SCr 0.89  LFTs:  WNL and Tbili improved to 0.8  TGs:  119 (9/12)  Prealbumin:  5 (9/12)  NG tube output decreased to 350 ml/24h, drain output total increased to 220 ml/24h    NUTRITIONAL GOALS                                                                                             RD recs (9/12):  110-127 Protein, 2225-2460 Kcal  Clinimix 5/20 at a goal rate of 83 ml/hr + 20% fat emulsion 240 ml/day to provide: 100 g of protein and 2233 kCals per day meeting 91% of protein and 100% of kCal needs. - Glucose infusion rate will be  3.5 mg/kg/min (max 5 mg/kg/min) - Limiting TPN rate to 83 ml/hr (one full 2L bag) in order to minimize waste due to ongoing national IVF and TPN shortages.  Meeting > 90% of estimated needs.   PLAN  At 1800 today:  Advance to Clinimix E 5/20 at 60 ml/hr.   20% fat emulsion at 20 ml/hr x12 hours/day  Plan to advance as tolerated to the goal rate.  TPN to contain standard multivitamins and trace element daily.  Reduce IVF to 65 ml/hr, change to normal saline for sodium replacement.  Continue CBGs and sensitive SSI q8h.   TPN lab panels on Mondays & Thursdays.  BMET, Mag, and Phos with AM labs.  Lynann Beaver PharmD, BCPS Pager [phone] 01/31/2017 7:30 AM

## 2017-01-31 NOTE — Progress Notes (Signed)
Patient ID: Adam Mcdowell, male   DOB: [birth year], 21 y.o.   MRN: [id number] [facility] Surgery Progress Note:   * No surgery found *  Subjective: Mental status is clear.  Reports the passage of flatus Objective: Vital signs in last 24 hours: Temp:  [98.6 F (37 C)-99.9 F (37.7 C)] 98.9 F (37.2 C) (09/13 0516) Pulse Rate:  [106-120] 106 (09/13 0516) Resp:  [16-20] 19 (09/13 0516) BP: (131-137)/(65-76) 131/65 (09/13 0516) SpO2:  [89 %-98 %] 98 % (09/13 0516) Weight:  [87.2 kg (192 lb 3.9 oz)] 87.2 kg (192 lb 3.9 oz) (09/12 1306)  Intake/Output from previous day: 09/12 0701 - 09/13 0700 In: 3813.7 [I.V.:3358.7; NG/GT:30; IV Piggyback:420] Out: 1120 [Urine:550; Emesis/NG output:350; Drains:220] Intake/Output this shift: No intake/output data recorded.  Physical Exam: Work of breathing is not as labored as yesterday.  Abdomen is distended as before.    Lab Results:  Results for orders placed or performed during the hospital encounter of 01/28/17 (from the past 48 hour(s))  Aerobic/Anaerobic Culture (surgical/deep wound)     Status: None (Preliminary result)   Collection Time: 01/29/17  2:03 PM  Result Value Ref Range   Specimen Description ABSCESS ABDOMEN    Special Requests NONE    Gram Stain      RARE WBC PRESENT, PREDOMINANTLY MONONUCLEAR ABUNDANT GRAM POSITIVE COCCI ABUNDANT GRAM NEGATIVE RODS    Culture      CULTURE REINCUBATED FOR BETTER GROWTH Performed at [facility], [place] [address] [place], [place] [address]    Report Status PENDING   CBC     Status: Abnormal   Collection Time: 01/30/17  8:48 AM  Result Value Ref Range   WBC 17.3 (H) 4.0 - 10.5 K/uL   RBC 4.10 (L) 4.22 - 5.81 MIL/uL   Hemoglobin 11.8 (L) 13.0 - 17.0 g/dL   HCT 34.8 (L) 39.0 - 52.0 %   MCV 84.9 78.0 - 100.0 fL   MCH 28.8 26.0 - 34.0 pg   MCHC 33.9 30.0 - 36.0 g/dL   RDW 14.2 11.5 - 15.5 %   Platelets 339 150 - 400 K/uL  Basic metabolic panel     Status: Abnormal   Collection  Time: 01/30/17  8:48 AM  Result Value Ref Range   Sodium 134 (L) 135 - 145 mmol/L   Potassium 4.2 3.5 - 5.1 mmol/L   Chloride 102 101 - 111 mmol/L   CO2 23 22 - 32 mmol/L   Glucose, Bld 113 (H) 65 - 99 mg/dL   BUN 15 6 - 20 mg/dL   Creatinine, Ser 0.99 0.61 - 1.24 mg/dL   Calcium 8.0 (L) 8.9 - 10.3 mg/dL   GFR calc non Af Amer >60 >60 mL/min   GFR calc Af Amer >60 >60 mL/min    Comment: (NOTE) The eGFR has been calculated using the CKD EPI equation. This calculation has not been validated in all clinical situations. eGFR's persistently <60 mL/min signify possible Chronic Kidney Disease.    Anion gap 9 5 - 15  Magnesium     Status: None   Collection Time: 01/30/17  8:48 AM  Result Value Ref Range   Magnesium 1.9 1.7 - 2.4 mg/dL  Phosphorus     Status: None   Collection Time: 01/30/17  8:48 AM  Result Value Ref Range   Phosphorus 3.5 2.5 - 4.6 mg/dL  Triglycerides     Status: None   Collection Time: 01/30/17  8:48 AM  Result Value Ref Range  Triglycerides 119 <150 mg/dL    Comment: Performed at [facility], [place] [address], [place],  [address]  Prealbumin     Status: Abnormal   Collection Time: 01/30/17 11:53 AM  Result Value Ref Range   Prealbumin 5.0 (L) 18 - 38 mg/dL    Comment: Performed at [place] [address], [place], [place] [address]  Glucose, capillary     Status: None   Collection Time: 01/30/17  4:48 PM  Result Value Ref Range   Glucose-Capillary 90 65 - 99 mg/dL  Glucose, capillary     Status: Abnormal   Collection Time: 01/31/17 12:03 AM  Result Value Ref Range   Glucose-Capillary 128 (H) 65 - 99 mg/dL  Comprehensive metabolic panel     Status: Abnormal   Collection Time: 01/31/17  4:59 AM  Result Value Ref Range   Sodium 134 (L) 135 - 145 mmol/L   Potassium 3.7 3.5 - 5.1 mmol/L   Chloride 101 101 - 111 mmol/L   CO2 26 22 - 32 mmol/L   Glucose, Bld 134 (H) 65 - 99 mg/dL   BUN 12 6 - 20 mg/dL   Creatinine, Ser 0.89 0.61 -  1.24 mg/dL   Calcium 7.8 (L) 8.9 - 10.3 mg/dL   Total Protein 5.8 (L) 6.5 - 8.1 g/dL   Albumin 2.3 (L) 3.5 - 5.0 g/dL   AST 41 15 - 41 U/L   ALT 45 17 - 63 U/L   Alkaline Phosphatase 69 38 - 126 U/L   Total Bilirubin 0.8 0.3 - 1.2 mg/dL   GFR calc non Af Amer >60 >60 mL/min   GFR calc Af Amer >60 >60 mL/min    Comment: (NOTE) The eGFR has been calculated using the CKD EPI equation. This calculation has not been validated in all clinical situations. eGFR's persistently <60 mL/min signify possible Chronic Kidney Disease.    Anion gap 7 5 - 15  CBC     Status: Abnormal   Collection Time: 01/31/17  4:59 AM  Result Value Ref Range   WBC 19.0 (H) 4.0 - 10.5 K/uL   RBC 3.78 (L) 4.22 - 5.81 MIL/uL   Hemoglobin 10.9 (L) 13.0 - 17.0 g/dL   HCT 32.0 (L) 39.0 - 52.0 %   MCV 84.7 78.0 - 100.0 fL   MCH 28.8 26.0 - 34.0 pg   MCHC 34.1 30.0 - 36.0 g/dL   RDW 14.0 11.5 - 15.5 %   Platelets 413 (H) 150 - 400 K/uL  Differential     Status: Abnormal   Collection Time: 01/31/17  4:59 AM  Result Value Ref Range   Neutrophils Relative % 82 %   Lymphocytes Relative 11 %   Monocytes Relative 6 %   Eosinophils Relative 1 %   Basophils Relative 0 %   Neutro Abs 15.6 (H) 1.7 - 7.7 K/uL   Lymphs Abs 2.1 0.7 - 4.0 K/uL   Monocytes Absolute 1.1 (H) 0.1 - 1.0 K/uL   Eosinophils Absolute 0.2 0.0 - 0.7 K/uL   Basophils Absolute 0.0 0.0 - 0.1 K/uL   WBC Morphology MILD LEFT SHIFT (1-5% METAS, OCC MYELO, OCC BANDS)     Comment: TOXIC GRANULATION  Magnesium     Status: None   Collection Time: 01/31/17  4:59 AM  Result Value Ref Range   Magnesium 2.1 1.7 - 2.4 mg/dL  Phosphorus     Status: None   Collection Time: 01/31/17  4:59 AM  Result Value  Ref Range   Phosphorus 4.2 2.5 - 4.6 mg/dL    Radiology/Results: Ct Image Guided Drainage By Percutaneous Catheter  Result Date: 01/29/2017 INDICATION: History of ruptured appendicitis, now with 2 intra-abdominal fluid collections. Please perform CT-guided  drainage catheter placement for infection source control purposes. EXAM: CT IMAGE GUIDED DRAINAGE BY PERCUTANEOUS CATHETER x2 COMPARISON:  CT abdomen and pelvis - 01/28/2017 MEDICATIONS: The patient is currently admitted to the hospital and receiving intravenous antibiotics. The antibiotics were administered within an appropriate time frame prior to the initiation of the procedure. ANESTHESIA/SEDATION: Moderate (conscious) sedation was employed during this procedure. A total of Versed 3 mg and Fentanyl 100 mcg was administered intravenously. Moderate Sedation Time: 27 minutes. The patient's level of consciousness and vital signs were monitored continuously by radiology nursing throughout the procedure under my direct supervision. CONTRAST:  None COMPLICATIONS: None immediate. PROCEDURE: Informed written consent was obtained from the patient after a discussion of the risks, benefits and alternatives to treatment. The patient was placed supine on the CT gantry and a pre procedural CT was performed re-demonstrating the known abscess/fluid collection within the right mid hemiabdomen with dominant right lateral component measuring approximately 11.0 x 6.6 cm (image 5, series 2 and dominant collection within the left lower abdominal quadrant measuring approximately 6.4 x 4.3 cm (image 40, series 2). The procedure was planned. A timeout was performed prior to the initiation of the procedure. The skin overlying the right mid hemiabdomen as well as the ventral aspect of the left lower abdomen/ pelvis were prepped and draped in the usual sterile fashion. Attention was first paid towards the left lower quadrant fluid collection. The overlying soft tissues were anesthetized with 1% lidocaine with epinephrine. Appropriate trajectory was planned with the use of a 22 gauge spinal needle. An 18 gauge trocar needle was advanced into the abscess/fluid collection and a short Amplatz super stiff wire was coiled within the collection.  Appropriate positioning was confirmed with a limited CT scan. The tract was serially dilated allowing placement of a 10 [place] all-purpose drainage catheter. Appropriate positioning was confirmed with a limited postprocedural CT scan. Attention was now paid towards the dominant collection with the right mid hemiabdomen. The overlying soft tissues were anesthetized with 1% lidocaine with epinephrine. Appropriate trajectory was planned with the use of a 22 gauge spinal needle. An 18 gauge trocar needle was advanced into the abscess/fluid collection and a short Amplatz super stiff wire was coiled within the collection. Appropriate positioning was confirmed with a limited CT scan. The tract was serially dilated allowing placement of a 10 [place] all-purpose drainage catheter. Appropriate positioning was confirmed with a limited postprocedural CT scan. A total approximately 600 cc of purulent, foul smelling fluid was aspirated from both collections. The tubes were connected to drainage bags and sutured in place. A dressing was placed. The patient tolerated the procedures well without immediate post procedural complication. IMPRESSION: Successful CT guided placement of a 10 French all purpose drain catheter into the dominant collections within the right mid hemiabdomen and left lower abdomen / pelvis yielding a total of approximately 600 cc of purulent, foul smelling fluid. Samples were sent to the laboratory as requested by the ordering clinical team. PLAN: - Recommend diligent recording of drainage catheter outputs. - Would have low threshold is to repeat a contrast-enhanced abdominal CT to evaluate for interval enlargement of residual ill-defined fluid collections within the lower pelvis, currently too small to warrant percutaneous drainage. Electronically Signed   By: Sandi Mariscal  M.D.   On: 01/29/2017 14:31   Ct Image Guided Drainage By Percutaneous Catheter  Result Date: 01/29/2017 INDICATION: History of ruptured  appendicitis, now with 2 intra-abdominal fluid collections. Please perform CT-guided drainage catheter placement for infection source control purposes. EXAM: CT IMAGE GUIDED DRAINAGE BY PERCUTANEOUS CATHETER x2 COMPARISON:  CT abdomen and pelvis - 01/28/2017 MEDICATIONS: The patient is currently admitted to the hospital and receiving intravenous antibiotics. The antibiotics were administered within an appropriate time frame prior to the initiation of the procedure. ANESTHESIA/SEDATION: Moderate (conscious) sedation was employed during this procedure. A total of Versed 3 mg and Fentanyl 100 mcg was administered intravenously. Moderate Sedation Time: 27 minutes. The patient's level of consciousness and vital signs were monitored continuously by radiology nursing throughout the procedure under my direct supervision. CONTRAST:  None COMPLICATIONS: None immediate. PROCEDURE: Informed written consent was obtained from the patient after a discussion of the risks, benefits and alternatives to treatment. The patient was placed supine on the CT gantry and a pre procedural CT was performed re-demonstrating the known abscess/fluid collection within the right mid hemiabdomen with dominant right lateral component measuring approximately 11.0 x 6.6 cm (image 5, series 2 and dominant collection within the left lower abdominal quadrant measuring approximately 6.4 x 4.3 cm (image 40, series 2). The procedure was planned. A timeout was performed prior to the initiation of the procedure. The skin overlying the right mid hemiabdomen as well as the ventral aspect of the left lower abdomen/ pelvis were prepped and draped in the usual sterile fashion. Attention was first paid towards the left lower quadrant fluid collection. The overlying soft tissues were anesthetized with 1% lidocaine with epinephrine. Appropriate trajectory was planned with the use of a 22 gauge spinal needle. An 18 gauge trocar needle was advanced into the  abscess/fluid collection and a short Amplatz super stiff wire was coiled within the collection. Appropriate positioning was confirmed with a limited CT scan. The tract was serially dilated allowing placement of a 10 [place] all-purpose drainage catheter. Appropriate positioning was confirmed with a limited postprocedural CT scan. Attention was now paid towards the dominant collection with the right mid hemiabdomen. The overlying soft tissues were anesthetized with 1% lidocaine with epinephrine. Appropriate trajectory was planned with the use of a 22 gauge spinal needle. An 18 gauge trocar needle was advanced into the abscess/fluid collection and a short Amplatz super stiff wire was coiled within the collection. Appropriate positioning was confirmed with a limited CT scan. The tract was serially dilated allowing placement of a 10 [place] all-purpose drainage catheter. Appropriate positioning was confirmed with a limited postprocedural CT scan. A total approximately 600 cc of purulent, foul smelling fluid was aspirated from both collections. The tubes were connected to drainage bags and sutured in place. A dressing was placed. The patient tolerated the procedures well without immediate post procedural complication. IMPRESSION: Successful CT guided placement of a 10 French all purpose drain catheter into the dominant collections within the right mid hemiabdomen and left lower abdomen / pelvis yielding a total of approximately 600 cc of purulent, foul smelling fluid. Samples were sent to the laboratory as requested by the ordering clinical team. PLAN: - Recommend diligent recording of drainage catheter outputs. - Would have low threshold is to repeat a contrast-enhanced abdominal CT to evaluate for interval enlargement of residual ill-defined fluid collections within the lower pelvis, currently too small to warrant percutaneous drainage. Electronically Signed   By: Sandi Mariscal M.D.   On: 01/29/2017 14:31  Anti-infectives: Anti-infectives    Start     Dose/Rate Route Frequency Ordered Stop   01/31/17 0800  piperacillin-tazobactam (ZOSYN) IVPB 3.375 g     3.375 g 12.5 mL/hr over 240 Minutes Intravenous Every 8 hours 01/31/17 0756     01/28/17 2100  piperacillin-tazobactam (ZOSYN) IVPB 3.375 g  Status:  Discontinued     3.375 g 12.5 mL/hr over 240 Minutes Intravenous Every 8 hours 01/28/17 1333 01/31/17 0756   01/28/17 1230  piperacillin-tazobactam (ZOSYN) IVPB 3.375 g     3.375 g 100 mL/hr over 30 Minutes Intravenous  Once 01/28/17 1219 01/29/17 0758      Assessment/Plan: Problem List: Patient Active Problem List   Diagnosis Date Noted  . Acute appendicitis with rupture 01/28/2017    Will repeat CT scan today (as per prior CT report) as WBC has increased slightly and need to see if pelvic abscess is increasing in size.   * No surgery found *    LOS: 3 days   Matt B. Hassell Done, MD, [facility] [phone] beeper [phone]  01/31/2017 8:17 AM

## 2017-01-31 NOTE — Progress Notes (Signed)
Nutrition Follow-up  DOCUMENTATION CODES:   Not applicable  INTERVENTION:  - Continue TPN per Pharmacy. - Continue to monitor for diet advancement.  NUTRITION DIAGNOSIS:   Inadequate oral intake related to inability to eat as evidenced by NPO status. -ongoing  GOAL:   Patient will meet greater than or equal to 90% of their needs -unmet with NPO status and current TPN rate.   MONITOR:   Diet advancement, Weight trends, Labs, I & O's, Other (Comment) (TPN regimen)  ASSESSMENT:   21 year old white male with no previous past medical history who [facility] with a chief complaint of 4 days of abdominal pain. The patient reports right lower quadrant abdominal pain that started 9/7. Pain described as sharp, constant right lower quadrant pain with intermittent radiation to central lower abdomen. Denies aggravating or alleviating factors. Associated symptoms include abdominal bloating, belching, hiccups, cold sweats, nausea, and vomiting. The patient reports a small loose bowel movement this morning but states bowel function has been irregular. He initially thought he had the flu and attempted to take a cold and flu medication that he got over-the-counter with little relief of symptoms. Due to persistent pain and intolerance of oral intake he presented to the emergency department for evaluation.  9/13 Pt continues with NGT with 600cc in canister at this time. Pt denies abdominal pain but reports intermittent nausea; he has not vomited today. He has double lumen PICC and is currently receiving Clinimix E 5/20 @ 40 mL/hr with 20% ILE @ 20 mL/hr x12 hours which is providing 1325 kcal and 48 grams of protein. Pharmacy note from this AM states plan to increase TPN rate to 60 mL/hr.  Per Pharmacy, goal rate of Clinimix E 5/20 @ 83 mL/hr (limited rate to 2L/day d/t national shortages of TPN) with 20% ILE @ 20 mL/hr x12 hours. This  regimen will provide 2232 kcal (100% minimum estimated kcal need) and 100 grams of protein (91% minimum estimated protein need).  Pt states from 9/6-9/9 he consumed 1 breadstick, 2 small bowls of store-bough chicken noodle soup, and 1 package of Ramen noodles. Prior to 9/6 his appetite was normal/at baseline without any other recent changes. Noted that weight from admission until yesterday was +17 lbs/7.8 kg. No new weight this AM. Will monitor weight trends closely.   Medications reviewed; sliding scale Novolog. Labs reviewed;  CBGs: 128 and 101 mg/dL today, Na: 161134 mmol/L, Ca: 7.8 mg/dL.  IVF: D5-1/2 NS-20 mEq IV KCl @ 85 mL/hr (347 kcal from dextrose); to start NS @ 65 mL/hr today at 1800.    9/12 - PTA information outlined above from H&P.  - NGT was placed in the ED on 9/10 and Surgery PA note from this AM indicated 1500cc out over 24 hours.  - Plan to continue NGT although pt is allowed ice chips and sips of water with medications. - PICC being placed at this time; unable to talk with pt or obtain PTA information from him or family. - Physical assessment unable to be performed at this time.  - Per Care Everywhere review, pt weighed 170 lbs on 11/29/15.   Recommendations for TPN goal rate: Clinimix E 5/15 @ 100 mL/hr with 20% ILE @ 20 mL/hr x12 hours. This regimen will provide 2184 kcal (98% minimum estimated kcal need) and 120 grams of protein.   IVF: D5-1/2 NS-20 mEq KCl @ 125 mL/hr (510 kcal from dextrose).    Diet Order:  Diet NPO time specified Except for: Sips with Meds TPN (CLINIMIX-E) Adult .TPN (CLINIMIX-E) Adult  Skin:  Reviewed, no issues  Last BM:  9/10  Height:   Ht Readings from Last 1 Encounters:  01/28/17 5' 5.5" (1.664 m)    Weight:   Wt Readings from Last 1 Encounters:  01/30/17 192 lb 3.9 oz (87.2 kg)    Ideal Body Weight:  63.18 kg  BMI:  Body mass index is 31.5 kg/m.  Estimated Nutritional Needs:   Kcal:  2225-2460 (28-31  kcal/kg)  Protein:  110-127 grams (1.4-1.6 grams/kg)  Fluid:  >/= 2.2 L/day  EDUCATION NEEDS:   No education needs identified at this time    Trenton Gammon, MS, RD, LDN, CNSC Inpatient Clinical Dietitian Pager # [phone] After hours/weekend pager # [phone]

## 2017-01-31 NOTE — Sedation Documentation (Signed)
Pt c/o pain and pressure in abdomen

## 2017-02-01 LAB — BASIC METABOLIC PANEL
Anion gap: 6 (ref 5–15)
BUN: 11 mg/dL (ref 6–20)
CHLORIDE: 100 mmol/L — AB (ref 101–111)
CO2: 26 mmol/L (ref 22–32)
CREATININE: 0.78 mg/dL (ref 0.61–1.24)
Calcium: 7.7 mg/dL — ABNORMAL LOW (ref 8.9–10.3)
GFR calc Af Amer: >60 mL/min (ref >60)
Glucose, Bld: 131 mg/dL — ABNORMAL HIGH (ref 65–99)
Potassium: 3.8 mmol/L (ref 3.5–5.1)
SODIUM: 132 mmol/L — AB (ref 135–145)

## 2017-02-01 LAB — GLUCOSE, CAPILLARY
GLUCOSE-CAPILLARY: 124 mg/dL — AB (ref 65–99)
GLUCOSE-CAPILLARY: 121 mg/dL — AB (ref 65–99)
GLUCOSE-CAPILLARY: 125 mg/dL — AB (ref 65–99)

## 2017-02-01 LAB — PHOSPHORUS: Phosphorus: 3.6 mg/dL (ref 2.5–4.6)

## 2017-02-01 LAB — MAGNESIUM: MAGNESIUM: 1.9 mg/dL (ref 1.7–2.4)

## 2017-02-01 MED ORDER — SODIUM CHLORIDE 0.9 % IV SOLN
INTRAVENOUS | Status: DC
  Administered 2017-02-02 – 2017-02-11 (×7): via INTRAVENOUS

## 2017-02-01 MED ORDER — MAGNESIUM SULFATE 2 GM/50ML IV SOLN
2.0000 g | Freq: Once | INTRAVENOUS | Status: AC
  Administered 2017-02-01: 13:00:00 2 g via INTRAVENOUS
  Filled 2017-02-01: qty 50

## 2017-02-01 MED ORDER — FAT EMULSION 20 % IV EMUL
240.0000 mL | INTRAVENOUS | Status: AC
  Administered 2017-02-01: 18:00:00 240 mL via INTRAVENOUS
  Filled 2017-02-01: qty 250

## 2017-02-01 MED ORDER — TRACE MINERALS CR-CU-MN-SE-ZN 10-1000-500-60 MCG/ML IV SOLN
INTRAVENOUS | Status: AC
  Administered 2017-02-01: 18:00:00 via INTRAVENOUS
  Filled 2017-02-01: qty 1992

## 2017-02-01 NOTE — Progress Notes (Signed)
PHARMACY - ADULT TOTAL PARENTERAL NUTRITION CONSULT NOTE   Pharmacy Consult for TPN Indication: enteral intolerance, paralytic ileus  Patient Measurements: Body mass index is 31.5 kg/m. Filed Weights   01/28/17 1102 01/30/17 1306  Weight: 175 lb (79.4 kg) 192 lb 3.9 oz (87.2 kg)    HPI: 21 yoM presented to [facility] ED on 9/10 from [facility] with abdominal pain x4 days, bloating, belching, hiccups, sweats, N/V, unable to tolerate PO intake.  NG tube was placed on 9/10.  Found to have acute ruptured appendicitis and intra-abdominal abscesses.  S/p IR drainage on 9/11 with drains remaining in LLQ and R mid abdomen.  He remains at risk for needing exploratory laparotomy.  Pharmacy is consulted to dose dose TPN for enteral intolerance x6 days and paralytic ileus.  Significant events:  9/13 repeat CT pending  Insulin requirements past 24 hours: 2 units  Current Nutrition: NPO  IVF: D5 0.45% NaCl with KCl 20 mEq/L at 85 ml/hr - Change to 0.9% NaCl at 65 ml/hr on 9/13 - Change NS to 50 ml/hr on 9/14  Central access: PICC placed 9/12 TPN start date:  9/12  ASSESSMENT                                                                                                          Today:   Glucose: in goal range 100-150 (No hx DM)  Electrolytes:  Na remains slightly low at 132 (unable to adjust in premix TPN, will change maintenance IVF), others WNL including CorrCa WNL  Renal:  SCr 0.78  LFTs:  WNL and Tbili improved to 0.8 ( 9/13)  TGs:  119 (9/12)  Prealbumin:  5 (9/12)  NG tube output increased to 1250 ml/24h, drain output total increased to 79 ml/24h    NUTRITIONAL GOALS                                                                                             RD recs (9/12):  110-127 Protein, 2225-2460 Kcal  Clinimix 5/20 at a goal rate of 83 ml/hr + 20% fat emulsion 240 ml/day to provide: 100 g of protein and 2233 kCals per day meeting 91% of protein and 100% of kCal  needs. - Glucose infusion rate will be 3.5 mg/kg/min (max 5 mg/kg/min) - Limiting TPN rate to 83 ml/hr (one full 2L bag) in order to minimize waste due to ongoing national IVF and TPN shortages.  Meeting > 90% of estimated needs.   PLAN                  Magnesium sulfate 2 gr IV x1  At 1800 today:  Advance to Clinimix E 5/20 at 83 ml/hr, the target rate    20% fat emulsion at 20 ml/hr x12 hours/day  Plan to advance as tolerated to the goal rate.  TPN to contain standard multivitamins and trace element daily.  Reduce IVF to  NS 50 ml/hr at 1800  Continue CBGs and sensitive SSI q8h.   TPN lab panels on Mondays & Thursdays.  BMET, Mag, and Phos with AM labs.   Adalberto Cole, PharmD, BCPS Pager [phone] 02/01/2017 11:10 AM

## 2017-02-01 NOTE — Progress Notes (Signed)
Nutrition Follow-up  DOCUMENTATION CODES:   Not applicable  INTERVENTION:  - Continue TPN per Pharmacy. - Will monitor for NGT clamping/removal and diet advancement.   NUTRITION DIAGNOSIS:   Inadequate oral intake related to inability to eat as evidenced by NPO status. -ongoing  GOAL:   Patient will meet greater than or equal to 90% of their needs -will be met with TPN to be increased to goal rate today at 1800.  MONITOR:   Diet advancement, Weight trends, Labs, I & O's, Other (Comment) (TPN regimen)  ASSESSMENT:   21 year old white male with no previous past medical history who presented to [place] emergency department from the [place] [facility] with a chief complaint of 4 days of abdominal pain. The patient reports right lower quadrant abdominal pain that started 9/7. Pain described as sharp, constant right lower quadrant pain with intermittent radiation to central lower abdomen. Denies aggravating or alleviating factors. Associated symptoms include abdominal bloating, belching, hiccups, cold sweats, nausea, and vomiting. The patient reports a small loose bowel movement this morning but states bowel function has been irregular. He initially thought he had the flu and attempted to take a cold and flu medication that he got over-the-counter with little relief of symptoms. Due to persistent pain and intolerance of oral intake he presented to the emergency department for evaluation.  9/14 Pt remains NPO with NGT in place. Surgery PA note from this AM states possible NGT clamping and advancement to CLD over the weekend. He denies nausea this AM and states some abdominal tenderness near drain sites but no overt pain. No new weight since 9/12. Double lumen PICC and Pharmacy has ordered for TPN to increase to goal rate (as outlined below) today at 1800.   Medications reviewed; sliding scale Novolog, 2 g IV Mg sulfate x1 run today.  Labs reviewed; CBG: 124 mg/dL this AM, Na:  132 mmol/L, Cl: 100 mmol/L, Ca: 7.7 mg/dL.  IVF: NS @ 65 mL/hr to decrease to 50 mL/hr tonight at 1800.    9/13 - Pt continues with NGT with 600cc in canister at this time. - Pt denies abdominal pain but reports intermittent nausea; he has not vomited today.  - He has double lumen PICC and is currently receiving Clinimix E 5/20 @ 40 mL/hr with 20% ILE @ 20 mL/hr x12 hours. - This is providing 1325 kcal and 48 grams of protein.  - Pharmacy note from this AM states plan to increase TPN rate to 60 mL/hr. - Per Pharmacy, goal rate of Clinimix E 5/20 @ 83 mL/hr (limited rate to 2L/day d/t national shortages of TPN) with 20% ILE @ 20 mL/hr x12 hours.  - This regimen will provide 2232 kcal (100% minimum estimated kcal need) and 100 grams of protein (91% minimum estimated protein need). - Pt states from 9/6-9/9 he consumed 1 breadstick, 2 small bowls of store-bough chicken noodle soup, and 1 package of Ramen noodles.  - Prior to 9/6 his appetite was normal/at baseline without any other recent changes.  - Noted that weight from admission until yesterday was +17 lbs/7.8 kg.   IVF: D5-1/2 NS-20 mEq IV KCl @ 85 mL/hr (347 kcal from dextrose); to start NS @ 65 mL/hr today at 1800.   9/12 - PTA information outlined above from H&P.  - NGT was placed in the ED on 9/10 and Surgery PA note from this AM indicated 1500cc out over 24 hours.  - Plan to continue NGT although pt is allowed ice  chips and sips of water with medications. - PICC being placed at this time; unable to talk with pt or obtain PTA information from him or family. - Physical assessment unable to be performed at this time.  - Per Care Everywhere review, pt weighed 170 lbs on 11/29/15.   Recommendations for TPN goal rate: Clinimix E 5/15 @ 100 mL/hr with 20% ILE @ 20 mL/hr x12 hours. This regimen will provide 2184 kcal (98% minimum estimated kcal need) and 120 grams of protein.   IVF:D5-1/2 NS-20 mEq KCl @ 125 mL/hr (510 kcal from  dextrose).     Diet Order:  .TPN (CLINIMIX-E) Adult .TPN (CLINIMIX-E) Adult  Skin:  Reviewed, no issues  Last BM:  9/13  Height:   Ht Readings from Last 1 Encounters:  01/28/17 5' 5.5" (1.664 m)    Weight:   Wt Readings from Last 1 Encounters:  01/30/17 192 lb 3.9 oz (87.2 kg)    Ideal Body Weight:  63.18 kg  BMI:  Body mass index is 31.5 kg/m.  Estimated Nutritional Needs:   Kcal:  2225-2460 (28-31 kcal/kg)  Protein:  110-127 grams (1.4-1.6 grams/kg)  Fluid:  >/= 2.2 L/day  EDUCATION NEEDS:   No education needs identified at this time    Jarome Matin, MS, RD, LDN, CNSC Inpatient Clinical Dietitian Pager # [phone] After hours/weekend pager # [phone]

## 2017-02-01 NOTE — Progress Notes (Signed)
Referring Physician(s): Martin,M  Supervising Physician: Malachy Moan  Patient Status:  Adam Mcdowell  Chief Complaint:  Appendiceal abscess  Subjective: Pt without new c/o; sore at drain sites as expected; NG tube still in place   Allergies: Patient has no known allergies.  Medications: Prior to Admission medications   Medication Sig Start Date End Date Taking? Authorizing Provider  ibuprofen (ADVIL,MOTRIN) 200 MG tablet Take 400 mg by mouth every 6 (six) hours as needed for moderate pain or cramping.   Yes [provider]     Vital Signs: BP 126/65 (BP Location: Left Arm)   Pulse 100   Temp 99.7 F (37.6 C) (Oral)   Resp 16   Ht 5' 5.5" (1.664 m)   Wt 192 lb 3.9 oz (87.2 kg)   SpO2 94%   BMI 31.50 kg/m   Physical Exam awake/alert; R/L/TG drains intact, dressings dry, mildly tender, outputs 02/28/59 cc respectively; drains being irrigated without difficulty per nurse; abd still with mod dist  Imaging: Ct Abdomen Pelvis W Contrast  Result Date: 01/31/2017 CLINICAL DATA:  History of ruptured appendicitis, post placement of 2 percutaneous drainage catheters on 01/29/2017. Evaluate for residual intra-abdominal fluid collections. EXAM: CT ABDOMEN AND PELVIS WITH CONTRAST TECHNIQUE: Multidetector CT imaging of the abdomen and pelvis was performed using the standard protocol following bolus administration of intravenous contrast. CONTRAST:  ISOVUE-300 IOPAMIDOL (ISOVUE-300) INJECTION 61% COMPARISON:  CT abdomen pelvis - 01/28/2017; CT-guided percutaneous drainage catheter placement -01/29/2017 FINDINGS: Lower chest: Limited visualization of the lower thorax demonstrates development of a small right and trace left-sided pleural effusions with associated bibasilar opacities, likely atelectasis. Borderline cardiomegaly. Trace amount of pericardial fluid, presumably physiologic. Hepatobiliary: Normal hepatic contour. No discrete hepatic lesions. Normal  appearance of the gallbladder given degree distention. No radiopaque gallstones. No intra extrahepatic biliary duct dilatation. Pancreas: Normal appearance of the pancreas Spleen: Normal appearance of the spleen. Note is made of a small splenule caudal to the spleen. Adrenals/Urinary Tract: There is symmetric enhancement and excretion of the bilateral kidneys. No definite renal stones this postcontrast examination. No discrete renal lesions. No urinary obstruction or perinephric stranding. Normal appearance the bilateral adrenal glands. Normal appearance of the urinary bladder given degree distention. Stomach/Bowel: Enteric tube tip terminates within the mid body of the stomach. There is persistent marked gas distention of multiple loops of small bowel without a definitive transition point, likely indicative of an ileus. There is been interval migration of previously noted appendicolith from the base of the appendix, now within the cecum (coronal image 42, series 5). There has been apparent avulsion of the root / base of the appendix with associated adjacent air and fluid containing structure measuring approximately 1.3 x 5.1 cm (axial image 68, series 2) which tracks towards a more well-defined fluid collection within the pre - rectal space (coronal images 49 through 67, series 5) which now measures approximately 4.7 x 3.1 cm (image 81, series 2), previously, 4.4 x 2.3 cm. Interval resolution of fluid collection within in the left lower abdomen following percutaneous drainage catheter placement. Unfortunately, there is a persistent serpiginous fluid collection along the right mid hemiabdomen which measures approximately 10.7 x 8.2 x 11.1 cm (as measured in greatest oblique short axis coronal- image 30, series 5 and axial - image 57, series 2) images respectively despite at percutaneous drainage catheter coiled within the cranial aspect of this collection. Vascular/Lymphatic: Normal caliber the abdominal aorta. The  branch vessels of the abdominal aorta appear patent  on this non CTA examination. Scattered retroperitoneal and mesenteric lymph nodes are numerous though individually not enlarged by size criteria presumably reactive etiology. Reproductive: Normal appearance of the prostate gland. Small amount of presumably reactive fluid in the pelvic cul-de-sac. Other: Mild diffuse body wall edema, most conspicuous about the thighs bilaterally. Musculoskeletal: No acute or aggressive osseous abnormalities. IMPRESSION: 1. Persistent approximately 11.1 cm air and fluid containing abscess within the right lateral abdominal quadrant, percutaneous drainage catheter positioning within the cranial lateral aspect of the collection - drainage catheter repositioning could be performed as indicated. 2. Interval resolution of left lower quadrant abscess following percutaneous drainage catheter placement. 3. Migration of previously noted appendicolith from the base of the appendix into the cecum however there is apparent avulsion involving the base of the appendix with associated fluid collection tracking to the level of the pre rectal space with pelvic fluid collection now measuring approximately 4.7 x 3.1 cm, previously, 4.4 x 2.3 cm. 4. Persistent diffuse gaseous distention of the bowel small without definitive transition point - findings suggestive of ileus. 5. Interval development of small/trace bilateral effusions, right greater than left, presumed reactive. Electronically Signed   By: Simonne Come M.D.   On: 01/31/2017 16:41   Ct Abdomen Pelvis W Contrast  Result Date: 01/28/2017 CLINICAL DATA:  Abdominal pain with nausea and vomiting. EXAM: CT ABDOMEN AND PELVIS WITH CONTRAST TECHNIQUE: Multidetector CT imaging of the abdomen and pelvis was performed using the standard protocol following bolus administration of intravenous contrast. CONTRAST:  ISOVUE-300 IOPAMIDOL (ISOVUE-300) INJECTION 61% COMPARISON:  None. FINDINGS: Lower  chest:  Trace right pleural effusion. Hepatobiliary: No focal liver abnormality is seen. No gallstones, gallbladder wall thickening, or biliary dilatation. Pancreas: Unremarkable. No pancreatic ductal dilatation or surrounding inflammatory changes. Spleen: Normal in size without focal abnormality. Adrenals/Urinary Tract: Adrenal glands are unremarkable. Kidneys are normal, without renal calculi, focal lesion, or hydronephrosis. Mild prominence of the right greater than left mid ureters is likely reactive. Bladder is unremarkable. Stomach/Bowel: The appendix is markedly dilated with surrounding periappendiceal inflammatory stranding and an appendicolith at the appendix base. There are multiple dilated loops of small bowel with air-fluid levels to the level of the distal ileum. A few loops of proximal ileum remain nondilated. There is reactive wall thickening of the cecum and ascending colon, as well as the terminal ileum. The stomach is within normal limits. Vascular/Lymphatic: No significant vascular findings are present. No enlarged abdominal or pelvic lymph nodes. Multiple prominent mesenteric lymph nodes are likely reactive. Reproductive: Prostate is unremarkable. Other: There is a loculated, rim enhancing fluid collection inferior to the liver, measuring approximately 11.0 x 7.3 x 19.1 cm, extending along the right paracolic gutter into the pelvis. There is an additional loculated fluid collection containing a few foci of air in the deep pelvis between the bladder and rectum measuring approximately 2.3 x 4.4 x 2.6 cm. This fluid collection extends superiorly along the bilateral paracolic gutters. The larger portion in the left lower pelvis measures approximately 6.6 x 5.1 x 5.4 cm. Musculoskeletal: No acute or significant osseous findings. IMPRESSION: 1. Acute, ruptured appendicitis with two large intra-abdominal abscesses, one inferior to the liver and tracking along the right paracolic gutter, and a second one  in the deep pelvis extending along the left inferior paracolic gutter. 2. Small bowel obstruction to the level of the distal ileum, where there is a transition point that is likely related to the prominent mesenteric reactive changes. 3. Trace right pleural effusion. These results  were called by telephone at the time of interpretation on 01/28/2017 at 1:11 pm to Dr. Shaune Pollack , who verbally acknowledged these results. Electronically Signed   By: Obie Dredge M.D.   On: 01/28/2017 13:13   Dg Abd Portable 1v  Result Date: 01/28/2017 CLINICAL DATA:  21 year old male with ruptured appendicitis. Nasogastric tube placement EXAM: PORTABLE ABDOMEN - 1 VIEW COMPARISON:  CT of the same day FINDINGS: Gastric tube terminates within the left upper quadrant. Gaseous distention of small bowel with relative decompression of the colon. Excreted contrast within the bilateral kidneys and ureters. IMPRESSION: Gastric tube terminates within the left upper quadrant. Gaseous distention of small bowel, either ileus or small-bowel obstruction. Excreted contrast within the bilateral collecting system and ureters. Electronically Signed   By: Gilmer Mor D.O.   On: 01/28/2017 15:58   Ct Image Guided Drainage By Percutaneous Catheter  Result Date: 01/31/2017 INDICATION: Pelvic abscess EXAM: CT GUIDED TRANS GLUTEAL DRAINAGE OF A PELVIC ABSCESS MEDICATIONS: The patient is currently admitted to the hospital and receiving intravenous antibiotics. The antibiotics were administered within an appropriate time frame prior to the initiation of the procedure. ANESTHESIA/SEDATION: Two mg IV Versed. 150 mcg IV Fentanyl. Dilaudid 1 mg. Zofran 4 mg. Moderate Sedation Time:  19 The patient was continuously monitored during the procedure by the interventional radiology nurse under my direct supervision. COMPLICATIONS: None immediate. TECHNIQUE: Informed written consent was obtained from the patient after a thorough discussion of the procedural  risks, benefits and alternatives. All questions were addressed. Maximal Sterile Barrier Technique was utilized including caps, mask, sterile gowns, sterile gloves, sterile drape, hand hygiene and skin antiseptic. A timeout was performed prior to the initiation of the procedure. PROCEDURE: The right gluteal region in the prone position was prepped with ChloraPrep in a sterile fashion, and a sterile drape was applied covering the operative field. A sterile gown and sterile gloves were used for the procedure. Local anesthesia was provided with 1% Lidocaine. Under CT guidance, an 18 gauge needle was inserted into the pelvic abscess via right trans gluteal approach. The needle was removed over an Amplatz wire. Twelve [place] dilator followed by a 12 [place] drain were inserted. It was looped and string fixed within the fluid collection. Frank pus was aspirated. FINDINGS: Imaging confirms placement of a 15 French drain into a pelvic abscess via right trans gluteal approach. IMPRESSION: Successful CT-guided right trans gluteal pelvic abscess drainage yielding pus. Electronically Signed   By: Jolaine Click M.D.   On: 01/31/2017 16:30   Ct Image Guided Drainage By Percutaneous Catheter  Result Date: 01/29/2017 INDICATION: History of ruptured appendicitis, now with 2 intra-abdominal fluid collections. Please perform CT-guided drainage catheter placement for infection source control purposes. EXAM: CT IMAGE GUIDED DRAINAGE BY PERCUTANEOUS CATHETER x2 COMPARISON:  CT abdomen and pelvis - 01/28/2017 MEDICATIONS: The patient is currently admitted to the hospital and receiving intravenous antibiotics. The antibiotics were administered within an appropriate time frame prior to the initiation of the procedure. ANESTHESIA/SEDATION: Moderate (conscious) sedation was employed during this procedure. A total of Versed 3 mg and Fentanyl 100 mcg was administered intravenously. Moderate Sedation Time: 27 minutes. The patient's level of  consciousness and vital signs were monitored continuously by radiology nursing throughout the procedure under my direct supervision. CONTRAST:  None COMPLICATIONS: None immediate. PROCEDURE: Informed written consent was obtained from the patient after a discussion of the risks, benefits and alternatives to treatment. The patient was placed supine on the CT gantry and a pre  procedural CT was performed re-demonstrating the known abscess/fluid collection within the right mid hemiabdomen with dominant right lateral component measuring approximately 11.0 x 6.6 cm (image 5, series 2 and dominant collection within the left lower abdominal quadrant measuring approximately 6.4 x 4.3 cm (image 40, series 2). The procedure was planned. A timeout was performed prior to the initiation of the procedure. The skin overlying the right mid hemiabdomen as well as the ventral aspect of the left lower abdomen/ pelvis were prepped and draped in the usual sterile fashion. Attention was first paid towards the left lower quadrant fluid collection. The overlying soft tissues were anesthetized with 1% lidocaine with epinephrine. Appropriate trajectory was planned with the use of a 22 gauge spinal needle. An 18 gauge trocar needle was advanced into the abscess/fluid collection and a short Amplatz super stiff wire was coiled within the collection. Appropriate positioning was confirmed with a limited CT scan. The tract was serially dilated allowing placement of a 10 [place] all-purpose drainage catheter. Appropriate positioning was confirmed with a limited postprocedural CT scan. Attention was now paid towards the dominant collection with the right mid hemiabdomen. The overlying soft tissues were anesthetized with 1% lidocaine with epinephrine. Appropriate trajectory was planned with the use of a 22 gauge spinal needle. An 18 gauge trocar needle was advanced into the abscess/fluid collection and a short Amplatz super stiff wire was coiled within  the collection. Appropriate positioning was confirmed with a limited CT scan. The tract was serially dilated allowing placement of a 10 [place] all-purpose drainage catheter. Appropriate positioning was confirmed with a limited postprocedural CT scan. A total approximately 600 cc of purulent, foul smelling fluid was aspirated from both collections. The tubes were connected to drainage bags and sutured in place. A dressing was placed. The patient tolerated the procedures well without immediate post procedural complication. IMPRESSION: Successful CT guided placement of a 10 French all purpose drain catheter into the dominant collections within the right mid hemiabdomen and left lower abdomen / pelvis yielding a total of approximately 600 cc of purulent, foul smelling fluid. Samples were sent to the laboratory as requested by the ordering clinical team. PLAN: - Recommend diligent recording of drainage catheter outputs. - Would have low threshold is to repeat a contrast-enhanced abdominal CT to evaluate for interval enlargement of residual ill-defined fluid collections within the lower pelvis, currently too small to warrant percutaneous drainage. Electronically Signed   By: Simonne Come M.D.   On: 01/29/2017 14:31   Ct Image Guided Drainage By Percutaneous Catheter  Result Date: 01/29/2017 INDICATION: History of ruptured appendicitis, now with 2 intra-abdominal fluid collections. Please perform CT-guided drainage catheter placement for infection source control purposes. EXAM: CT IMAGE GUIDED DRAINAGE BY PERCUTANEOUS CATHETER x2 COMPARISON:  CT abdomen and pelvis - 01/28/2017 MEDICATIONS: The patient is currently admitted to the hospital and receiving intravenous antibiotics. The antibiotics were administered within an appropriate time frame prior to the initiation of the procedure. ANESTHESIA/SEDATION: Moderate (conscious) sedation was employed during this procedure. A total of Versed 3 mg and Fentanyl 100 mcg was  administered intravenously. Moderate Sedation Time: 27 minutes. The patient's level of consciousness and vital signs were monitored continuously by radiology nursing throughout the procedure under my direct supervision. CONTRAST:  None COMPLICATIONS: None immediate. PROCEDURE: Informed written consent was obtained from the patient after a discussion of the risks, benefits and alternatives to treatment. The patient was placed supine on the CT gantry and a pre procedural CT was performed re-demonstrating the  known abscess/fluid collection within the right mid hemiabdomen with dominant right lateral component measuring approximately 11.0 x 6.6 cm (image 5, series 2 and dominant collection within the left lower abdominal quadrant measuring approximately 6.4 x 4.3 cm (image 40, series 2). The procedure was planned. A timeout was performed prior to the initiation of the procedure. The skin overlying the right mid hemiabdomen as well as the ventral aspect of the left lower abdomen/ pelvis were prepped and draped in the usual sterile fashion. Attention was first paid towards the left lower quadrant fluid collection. The overlying soft tissues were anesthetized with 1% lidocaine with epinephrine. Appropriate trajectory was planned with the use of a 22 gauge spinal needle. An 18 gauge trocar needle was advanced into the abscess/fluid collection and a short Amplatz super stiff wire was coiled within the collection. Appropriate positioning was confirmed with a limited CT scan. The tract was serially dilated allowing placement of a 10 [place] all-purpose drainage catheter. Appropriate positioning was confirmed with a limited postprocedural CT scan. Attention was now paid towards the dominant collection with the right mid hemiabdomen. The overlying soft tissues were anesthetized with 1% lidocaine with epinephrine. Appropriate trajectory was planned with the use of a 22 gauge spinal needle. An 18 gauge trocar needle was advanced  into the abscess/fluid collection and a short Amplatz super stiff wire was coiled within the collection. Appropriate positioning was confirmed with a limited CT scan. The tract was serially dilated allowing placement of a 10 [place] all-purpose drainage catheter. Appropriate positioning was confirmed with a limited postprocedural CT scan. A total approximately 600 cc of purulent, foul smelling fluid was aspirated from both collections. The tubes were connected to drainage bags and sutured in place. A dressing was placed. The patient tolerated the procedures well without immediate post procedural complication. IMPRESSION: Successful CT guided placement of a 10 French all purpose drain catheter into the dominant collections within the right mid hemiabdomen and left lower abdomen / pelvis yielding a total of approximately 600 cc of purulent, foul smelling fluid. Samples were sent to the laboratory as requested by the ordering clinical team. PLAN: - Recommend diligent recording of drainage catheter outputs. - Would have low threshold is to repeat a contrast-enhanced abdominal CT to evaluate for interval enlargement of residual ill-defined fluid collections within the lower pelvis, currently too small to warrant percutaneous drainage. Electronically Signed   By: Simonne Come M.D.   On: 01/29/2017 14:31    Labs:  CBC:  Recent Labs  01/28/17 1120 01/29/17 0452 01/30/17 0848 01/31/17 0459  WBC 21.8* 16.4* 17.3* 19.0*  HGB 14.6 12.1* 11.8* 10.9*  HCT 40.6 35.4* 34.8* 32.0*  PLT 275 291 339 413*    COAGS:  Recent Labs  01/29/17 0452  INR 1.15    BMP:  Recent Labs  01/29/17 0452 01/30/17 0848 01/31/17 0459 02/01/17 0523  NA 134* 134* 134* 132*  K 4.1 4.2 3.7 3.8  CL 102 102 101 100*  CO2 GLUCOSE 141* 113* 134* 131*  BUN CALCIUM 8.1* 8.0* 7.8* 7.7*  CREATININE 1.15 0.99 0.89 0.78  GFRNONAA >60 >60 >60 >60  GFRAA >60 >60 >60 >60    LIVER FUNCTION  TESTS:  Recent Labs  01/28/17 1120 01/31/17 0459  BILITOT 1.8* 0.8  AST 31 41  ALT 34 45  ALKPHOS 79 69  PROT 8.0 5.8*  ALBUMIN 3.6 2.3*    Assessment and Plan: Pt with hx ruptured  appendicitis/intra abd abscesses; s/p  R/L abd drains x2 9/11; transgluteal drain 9/13; temp 99.7; drain fluid cx's pend; cont current tx/monitor labs ; OOB; check f/u CT within 1 week of drain placement; other plans as per [facility]   Electronically Signed: D. Jeananne Rama, PA-C 02/01/2017, 9:27 AM   I spent a total of 15 minutes at the the patient's bedside AND on the patient's hospital floor or unit, greater than 50% of which was counseling/coordinating care for abdominal abscess drains    Patient ID: Adam Mcdowell, male   DOB: Jan 01, 1996, 21 y.o.   MRN: [id number]

## 2017-02-01 NOTE — Progress Notes (Signed)
[facility] Progress Note     Subjective: CC:  Reports feeling slightly improved compared to yesterday. Feels his "stomach rumbling" and reports abdominal tenderness around drain sites. Reports having a BM yesterday and passing some flatus. Having throat aggravation from NGT. Did not mobilize after drain placement.   Objective: Vital signs in last 24 hours: Temp:  [99 F (37.2 C)-100.8 F (38.2 C)] 99.7 F (37.6 C) (09/14 0543) Pulse Rate:  [100-117] 100 (09/14 0543) Resp:  [11-18] 16 (09/14 0543) BP: (113-141)/(58-94) 126/65 (09/14 0543) SpO2:  [93 %-97 %] 94 % (09/14 0543) Last BM Date: 01/28/17  Intake/Output from previous day: 09/13 0701 - 09/14 0700 In: 2696 [P.O.:180; I.V.:1741; NG/GT:500; IV Piggyback:260] Out: 1829 [Urine:500; Emesis/NG output:1250; Drains:79] Intake/Output this shift: Total I/O In: 50 [IV Piggyback:50] Out: -   PE: Gen:  Alert, NAD, pleasant Card:  Regular rate and rhythm, pedal pulses 2+ BL Pulm:  Normal respiratory effort, clear to auscultation bilaterally Abd: Soft, appropriately tender, moderately distended and tympanic, absent bowel sounds, drain site C/D/I  NGT 1,250 cc/24h, clear w/ light brown solid material. Bilious yesterday. Skin: warm and dry, no rashes  Psych: A&Ox3   Lab Results:   Recent Labs  01/30/17 0848 01/31/17 0459  WBC 17.3* 19.0*  HGB 11.8* 10.9*  HCT 34.8* 32.0*  PLT 339 413*   BMET  Recent Labs  01/31/17 0459 02/01/17 0523  NA 134* 132*  K 3.7 3.8  CL 101 100*  CO2 26 26  GLUCOSE 134* 131*  BUN 12 11  CREATININE 0.89 0.78  CALCIUM 7.8* 7.7*   PT/INR No results for input(s): LABPROT, INR in the last 72 hours. CMP     Component Value Date/Time   NA 132 (L) 02/01/2017 0523   K 3.8 02/01/2017 0523   CL 100 (L) 02/01/2017 0523   CO2 26 02/01/2017 0523   GLUCOSE 131 (H) 02/01/2017 0523   BUN 11 02/01/2017 0523   CREATININE 0.78 02/01/2017 0523   CALCIUM 7.7 (L) 02/01/2017 0523   PROT  5.8 (L) 01/31/2017 0459   ALBUMIN 2.3 (L) 01/31/2017 0459   AST 41 01/31/2017 0459   ALT 45 01/31/2017 0459   ALKPHOS 69 01/31/2017 0459   BILITOT 0.8 01/31/2017 0459   GFRNONAA >60 02/01/2017 0523   GFRAA >60 02/01/2017 0523   Lipase     Component Value Date/Time   LIPASE 35 01/28/2017 1120       Studies/Results: Ct Abdomen Pelvis W Contrast  Result Date: 01/31/2017 CLINICAL DATA:  History of ruptured appendicitis, post placement of 2 percutaneous drainage catheters on 01/29/2017. Evaluate for residual intra-abdominal fluid collections. EXAM: CT ABDOMEN AND PELVIS WITH CONTRAST TECHNIQUE: Multidetector CT imaging of the abdomen and pelvis was performed using the standard protocol following bolus administration of intravenous contrast. CONTRAST:  ISOVUE-300 IOPAMIDOL (ISOVUE-300) INJECTION 61% COMPARISON:  CT abdomen pelvis - 01/28/2017; CT-guided percutaneous drainage catheter placement -01/29/2017 FINDINGS: Lower chest: Limited visualization of the lower thorax demonstrates development of a small right and trace left-sided pleural effusions with associated bibasilar opacities, likely atelectasis. Borderline cardiomegaly. Trace amount of pericardial fluid, presumably physiologic. Hepatobiliary: Normal hepatic contour. No discrete hepatic lesions. Normal appearance of the gallbladder given degree distention. No radiopaque gallstones. No intra extrahepatic biliary duct dilatation. Pancreas: Normal appearance of the pancreas Spleen: Normal appearance of the spleen. Note is made of a small splenule caudal to the spleen. Adrenals/Urinary Tract: There is symmetric enhancement and excretion of the bilateral kidneys. No definite renal stones  this postcontrast examination. No discrete renal lesions. No urinary obstruction or perinephric stranding. Normal appearance the bilateral adrenal glands. Normal appearance of the urinary bladder given degree distention. Stomach/Bowel: Enteric tube tip  terminates within the mid body of the stomach. There is persistent marked gas distention of multiple loops of small bowel without a definitive transition point, likely indicative of an ileus. There is been interval migration of previously noted appendicolith from the base of the appendix, now within the cecum (coronal image 42, series 5). There has been apparent avulsion of the root / base of the appendix with associated adjacent air and fluid containing structure measuring approximately 1.3 x 5.1 cm (axial image 68, series 2) which tracks towards a more well-defined fluid collection within the pre - rectal space (coronal images 49 through 67, series 5) which now measures approximately 4.7 x 3.1 cm (image 81, series 2), previously, 4.4 x 2.3 cm. Interval resolution of fluid collection within in the left lower abdomen following percutaneous drainage catheter placement. Unfortunately, there is a persistent serpiginous fluid collection along the right mid hemiabdomen which measures approximately 10.7 x 8.2 x 11.1 cm (as measured in greatest oblique short axis coronal- image 30, series 5 and axial - image 57, series 2) images respectively despite at percutaneous drainage catheter coiled within the cranial aspect of this collection. Vascular/Lymphatic: Normal caliber the abdominal aorta. The branch vessels of the abdominal aorta appear patent on this non CTA examination. Scattered retroperitoneal and mesenteric lymph nodes are numerous though individually not enlarged by size criteria presumably reactive etiology. Reproductive: Normal appearance of the prostate gland. Small amount of presumably reactive fluid in the pelvic cul-de-sac. Other: Mild diffuse body wall edema, most conspicuous about the thighs bilaterally. Musculoskeletal: No acute or aggressive osseous abnormalities. IMPRESSION: 1. Persistent approximately 11.1 cm air and fluid containing abscess within the right lateral abdominal quadrant, percutaneous  drainage catheter positioning within the cranial lateral aspect of the collection - drainage catheter repositioning could be performed as indicated. 2. Interval resolution of left lower quadrant abscess following percutaneous drainage catheter placement. 3. Migration of previously noted appendicolith from the base of the appendix into the cecum however there is apparent avulsion involving the base of the appendix with associated fluid collection tracking to the level of the pre rectal space with pelvic fluid collection now measuring approximately 4.7 x 3.1 cm, previously, 4.4 x 2.3 cm. 4. Persistent diffuse gaseous distention of the bowel small without definitive transition point - findings suggestive of ileus. 5. Interval development of small/trace bilateral effusions, right greater than left, presumed reactive. Electronically Signed   By: Simonne Come M.D.   On: 01/31/2017 16:41   Ct Image Guided Drainage By Percutaneous Catheter  Result Date: 01/31/2017 INDICATION: Pelvic abscess EXAM: CT GUIDED TRANS GLUTEAL DRAINAGE OF A PELVIC ABSCESS MEDICATIONS: The patient is currently admitted to the hospital and receiving intravenous antibiotics. The antibiotics were administered within an appropriate time frame prior to the initiation of the procedure. ANESTHESIA/SEDATION: Two mg IV Versed. 150 mcg IV Fentanyl. Dilaudid 1 mg. Zofran 4 mg. Moderate Sedation Time:  19 The patient was continuously monitored during the procedure by the interventional radiology nurse under my direct supervision. COMPLICATIONS: None immediate. TECHNIQUE: Informed written consent was obtained from the patient after a thorough discussion of the procedural risks, benefits and alternatives. All questions were addressed. Maximal Sterile Barrier Technique was utilized including caps, mask, sterile gowns, sterile gloves, sterile drape, hand hygiene and skin antiseptic. A timeout was performed prior to  the initiation of the procedure. PROCEDURE:  The right gluteal region in the prone position was prepped with ChloraPrep in a sterile fashion, and a sterile drape was applied covering the operative field. A sterile gown and sterile gloves were used for the procedure. Local anesthesia was provided with 1% Lidocaine. Under CT guidance, an 18 gauge needle was inserted into the pelvic abscess via right trans gluteal approach. The needle was removed over an Amplatz wire. Twelve [place] dilator followed by a 12 [place] drain were inserted. It was looped and string fixed within the fluid collection. Frank pus was aspirated. FINDINGS: Imaging confirms placement of a 6 French drain into a pelvic abscess via right trans gluteal approach. IMPRESSION: Successful CT-guided right trans gluteal pelvic abscess drainage yielding pus. Electronically Signed   By: Jolaine Click M.D.   On: 01/31/2017 16:30    Anti-infectives: Anti-infectives    Start     Dose/Rate Route Frequency Ordered Stop   01/31/17 0800  piperacillin-tazobactam (ZOSYN) IVPB 3.375 g     3.375 g 12.5 mL/hr over 240 Minutes Intravenous Every 8 hours 01/31/17 0756     01/28/17 2100  piperacillin-tazobactam (ZOSYN) IVPB 3.375 g  Status:  Discontinued     3.375 g 12.5 mL/hr over 240 Minutes Intravenous Every 8 hours 01/28/17 1333 01/31/17 0756   01/28/17 1230  piperacillin-tazobactam (ZOSYN) IVPB 3.375 g     3.375 g 100 mL/hr over 30 Minutes Intravenous  Once 01/28/17 1219 01/29/17 0758       Assessment/Plan Acute appendicitis with rupture and intra-abdominal abscesses S/P perc drainage by Dr. Grace Isaac 9/11: 10 Fr drain LLQ, 10 Fr Drain R mid-abdomen >> 600cc purulent fluid. S/P perc drainage by Dr. Bonnielee Haff 9/13: R transgluteal drain - follow CX; GS growing GNR, gram positive cocci - afebrile, WBC 17.3 yesterday, re-check in AM - continue IV abx - mobilize, IS  - PRN analgesics and antiemetics  Expected paralytic ileus - NG tube 1,250 cc/24h; continue NGT and away further bowel function; my  have some ice chips  FEN: NPO, NGT to LIWS, PICC and TNA   ID: Zosyn 9/10 >> VTE: SCD's, start SQ heparin   Plan: continue bowel rest, TNA, and NGT to LIWS. OOB to chair and walk in hall today. If bowel function continues to return may be able to clamp NGT and have clears over the weekend.   Though patient seems to have improved (less tachycardic/tachypneic, afebrile) he is still at risk for clinical deterioration and need for laparotomy. Follow vitals and perform regular abdominal exams.    LOS: 4 days    Adam Mcdowell , [facility] 02/01/2017, 7:24 AM Pager: [phone] Consults: [phone] Mon-Fri 7:00 am-4:30 pm Sat-Sun 7:00 am-11:30 am

## 2017-02-02 LAB — CBC
HCT: 29.6 % — ABNORMAL LOW (ref 39.0–52.0)
Hemoglobin: 10 g/dL — ABNORMAL LOW (ref 13.0–17.0)
MCH: 29.2 pg (ref 26.0–34.0)
MCHC: 33.8 g/dL (ref 30.0–36.0)
MCV: 86.5 fL (ref 78.0–100.0)
PLATELETS: 501 10*3/uL — AB (ref 150–400)
RBC: 3.42 MIL/uL — AB (ref 4.22–5.81)
RDW: 14 % (ref 11.5–15.5)
WBC: 17.4 10*3/uL — ABNORMAL HIGH (ref 4.0–10.5)

## 2017-02-02 LAB — BASIC METABOLIC PANEL
Anion gap: 5 (ref 5–15)
BUN: 9 mg/dL (ref 6–20)
CALCIUM: 7.7 mg/dL — AB (ref 8.9–10.3)
CO2: 25 mmol/L (ref 22–32)
CREATININE: 0.74 mg/dL (ref 0.61–1.24)
Chloride: 108 mmol/L (ref 101–111)
GFR calc Af Amer: >60 mL/min (ref >60)
GLUCOSE: 128 mg/dL — AB (ref 65–99)
Potassium: 3.9 mmol/L (ref 3.5–5.1)
SODIUM: 138 mmol/L (ref 135–145)

## 2017-02-02 LAB — GLUCOSE, CAPILLARY
Glucose-Capillary: 136 mg/dL — ABNORMAL HIGH (ref 65–99)
Glucose-Capillary: 121 mg/dL — ABNORMAL HIGH (ref 65–99)

## 2017-02-02 LAB — MAGNESIUM: MAGNESIUM: 2.2 mg/dL (ref 1.7–2.4)

## 2017-02-02 LAB — PHOSPHORUS: Phosphorus: 3.7 mg/dL (ref 2.5–4.6)

## 2017-02-02 MED ORDER — TRACE MINERALS CR-CU-MN-SE-ZN 10-1000-500-60 MCG/ML IV SOLN
INTRAVENOUS | Status: AC
  Administered 2017-02-02: 18:00:00 via INTRAVENOUS
  Filled 2017-02-02: qty 1992

## 2017-02-02 MED ORDER — FAT EMULSION 20 % IV EMUL
240.0000 mL | INTRAVENOUS | Status: AC
  Administered 2017-02-02: 240 mL via INTRAVENOUS
  Filled 2017-02-02: qty 250

## 2017-02-02 MED ORDER — POTASSIUM CHLORIDE 10 MEQ/50ML IV SOLN
10.0000 meq | INTRAVENOUS | Status: AC
  Administered 2017-02-02 (×2): 10 meq via INTRAVENOUS
  Filled 2017-02-02 (×2): qty 50

## 2017-02-02 NOTE — Progress Notes (Signed)
Patient ID: Adam Mcdowell, male   DOB: 07/06/1995, 21 y.o.   MRN: [id number]     Subjective: Feels about the same. Requiring occasional pain medicine but no worse. Has had flatus and bowel movement. Up walking in the hall.  Objective: Vital signs in last 24 hours: Temp:  [98.1 F (36.7 C)-99.4 F (37.4 C)] 98.1 F (36.7 C) (09/14 2018) Pulse Rate:  [92-97] 92 (09/14 2018) Resp:  [16] 16 (09/14 2018) BP: (132-139)/(65-70) 139/70 (09/14 2018) SpO2:  [97 %] 97 % (09/14 2018) Last BM Date: 02/01/17  Intake/Output from previous day: 09/14 0701 - 09/15 0700 In: 330 [I.V.:265; IV Piggyback:50] Out: 3375 [Urine:2450; Emesis/NG output:900; Drains:25] Intake/Output this shift: No intake/output data recorded.  General appearance: alert, cooperative and no distress GI: Mild distention. Soft and without significant tenderness or guarding. Drains left and right side with cloudy serosanguineous drainage.  Lab Results:   Recent Labs  01/31/17 0459 02/02/17 0402  WBC 19.0* 17.4*  HGB 10.9* 10.0*  HCT 32.0* 29.6*  PLT 413* 501*   BMET  Recent Labs  02/01/17 0523 02/02/17 0402  NA 132* 138  K 3.8 3.9  CL 100* 108  CO2 26 25  GLUCOSE 131* 128*  BUN 11 9  CREATININE 0.78 0.74  CALCIUM 7.7* 7.7*   Recent Results (from the past 240 hour(s))  Aerobic/Anaerobic Culture (surgical/deep wound)     Status: None   Collection Time: 01/29/17  2:03 PM  Result Value Ref Range Status   Specimen Description ABSCESS ABDOMEN  Final   Special Requests NONE  Final   Gram Stain   Final    RARE WBC PRESENT, PREDOMINANTLY MONONUCLEAR ABUNDANT GRAM POSITIVE COCCI ABUNDANT GRAM NEGATIVE RODS Performed at [facility], [address], [place], [place] [address]    Culture ABUNDANT ESCHERICHIA COLI  Final   Report Status 02/01/2017 FINAL  Final   Organism ID, Bacteria ESCHERICHIA COLI  Final      Susceptibility   Escherichia coli - MIC*    AMPICILLIN 8 SENSITIVE Sensitive     CEFAZOLIN  <=4 SENSITIVE Sensitive     CEFEPIME <=1 SENSITIVE Sensitive     CEFTAZIDIME <=1 SENSITIVE Sensitive     CEFTRIAXONE <=1 SENSITIVE Sensitive     CIPROFLOXACIN <=0.25 SENSITIVE Sensitive     GENTAMICIN <=1 SENSITIVE Sensitive     IMIPENEM <=0.25 SENSITIVE Sensitive     TRIMETH/SULFA <=20 SENSITIVE Sensitive     AMPICILLIN/SULBACTAM 4 SENSITIVE Sensitive     PIP/TAZO <=4 SENSITIVE Sensitive     Extended ESBL NEGATIVE Sensitive     * ABUNDANT ESCHERICHIA COLI  Aerobic/Anaerobic Culture (surgical/deep wound)     Status: None (Preliminary result)   Collection Time: 01/31/17  3:07 PM  Result Value Ref Range Status   Specimen Description ABSCESS PELVIS  Final   Special Requests NONE  Final   Gram Stain   Final    ABUNDANT WBC PRESENT, PREDOMINANTLY PMN ABUNDANT GRAM POSITIVE COCCI IN PAIRS FEW GRAM NEGATIVE RODS MODERATE GRAM POSITIVE RODS    Culture   Final    TOO YOUNG TO READ Performed at [facility], [address], [place], [place] [address]    Report Status PENDING  Incomplete    Studies/Results: Ct Abdomen Pelvis W Contrast  Result Date: 01/31/2017 CLINICAL DATA:  History of ruptured appendicitis, post placement of 2 percutaneous drainage catheters on 01/29/2017. Evaluate for residual intra-abdominal fluid collections. EXAM: CT ABDOMEN AND PELVIS WITH CONTRAST TECHNIQUE: Multidetector CT imaging  of the abdomen and pelvis was performed using the standard protocol following bolus administration of intravenous contrast. CONTRAST:  ISOVUE-300 IOPAMIDOL (ISOVUE-300) INJECTION 61% COMPARISON:  CT abdomen pelvis - 01/28/2017; CT-guided percutaneous drainage catheter placement -01/29/2017 FINDINGS: Lower chest: Limited visualization of the lower thorax demonstrates development of a small right and trace left-sided pleural effusions with associated bibasilar opacities, likely atelectasis. Borderline cardiomegaly. Trace amount of pericardial fluid, presumably physiologic.  Hepatobiliary: Normal hepatic contour. No discrete hepatic lesions. Normal appearance of the gallbladder given degree distention. No radiopaque gallstones. No intra extrahepatic biliary duct dilatation. Pancreas: Normal appearance of the pancreas Spleen: Normal appearance of the spleen. Note is made of a small splenule caudal to the spleen. Adrenals/Urinary Tract: There is symmetric enhancement and excretion of the bilateral kidneys. No definite renal stones this postcontrast examination. No discrete renal lesions. No urinary obstruction or perinephric stranding. Normal appearance the bilateral adrenal glands. Normal appearance of the urinary bladder given degree distention. Stomach/Bowel: Enteric tube tip terminates within the mid body of the stomach. There is persistent marked gas distention of multiple loops of small bowel without a definitive transition point, likely indicative of an ileus. There is been interval migration of previously noted appendicolith from the base of the appendix, now within the cecum (coronal image 42, series 5). There has been apparent avulsion of the root / base of the appendix with associated adjacent air and fluid containing structure measuring approximately 1.3 x 5.1 cm (axial image 68, series 2) which tracks towards a more well-defined fluid collection within the pre - rectal space (coronal images 49 through 67, series 5) which now measures approximately 4.7 x 3.1 cm (image 81, series 2), previously, 4.4 x 2.3 cm. Interval resolution of fluid collection within in the left lower abdomen following percutaneous drainage catheter placement. Unfortunately, there is a persistent serpiginous fluid collection along the right mid hemiabdomen which measures approximately 10.7 x 8.2 x 11.1 cm (as measured in greatest oblique short axis coronal- image 30, series 5 and axial - image 57, series 2) images respectively despite at percutaneous drainage catheter coiled within the cranial aspect of  this collection. Vascular/Lymphatic: Normal caliber the abdominal aorta. The branch vessels of the abdominal aorta appear patent on this non CTA examination. Scattered retroperitoneal and mesenteric lymph nodes are numerous though individually not enlarged by size criteria presumably reactive etiology. Reproductive: Normal appearance of the prostate gland. Small amount of presumably reactive fluid in the pelvic cul-de-sac. Other: Mild diffuse body wall edema, most conspicuous about the thighs bilaterally. Musculoskeletal: No acute or aggressive osseous abnormalities. IMPRESSION: 1. Persistent approximately 11.1 cm air and fluid containing abscess within the right lateral abdominal quadrant, percutaneous drainage catheter positioning within the cranial lateral aspect of the collection - drainage catheter repositioning could be performed as indicated. 2. Interval resolution of left lower quadrant abscess following percutaneous drainage catheter placement. 3. Migration of previously noted appendicolith from the base of the appendix into the cecum however there is apparent avulsion involving the base of the appendix with associated fluid collection tracking to the level of the pre rectal space with pelvic fluid collection now measuring approximately 4.7 x 3.1 cm, previously, 4.4 x 2.3 cm. 4. Persistent diffuse gaseous distention of the bowel small without definitive transition point - findings suggestive of ileus. 5. Interval development of small/trace bilateral effusions, right greater than left, presumed reactive. Electronically Signed   By: Simonne Come M.D.   On: 01/31/2017 16:41   Ct Image Guided Drainage By Percutaneous  Catheter  Result Date: 01/31/2017 INDICATION: Pelvic abscess EXAM: CT GUIDED TRANS GLUTEAL DRAINAGE OF A PELVIC ABSCESS MEDICATIONS: The patient is currently admitted to the hospital and receiving intravenous antibiotics. The antibiotics were administered within an appropriate time frame prior  to the initiation of the procedure. ANESTHESIA/SEDATION: Two mg IV Versed. 150 mcg IV Fentanyl. Dilaudid 1 mg. Zofran 4 mg. Moderate Sedation Time:  19 The patient was continuously monitored during the procedure by the interventional radiology nurse under my direct supervision. COMPLICATIONS: None immediate. TECHNIQUE: Informed written consent was obtained from the patient after a thorough discussion of the procedural risks, benefits and alternatives. All questions were addressed. Maximal Sterile Barrier Technique was utilized including caps, mask, sterile gowns, sterile gloves, sterile drape, hand hygiene and skin antiseptic. A timeout was performed prior to the initiation of the procedure. PROCEDURE: The right gluteal region in the prone position was prepped with ChloraPrep in a sterile fashion, and a sterile drape was applied covering the operative field. A sterile gown and sterile gloves were used for the procedure. Local anesthesia was provided with 1% Lidocaine. Under CT guidance, an 18 gauge needle was inserted into the pelvic abscess via right trans gluteal approach. The needle was removed over an Amplatz wire. Twelve [place] dilator followed by a 12 [place] drain were inserted. It was looped and string fixed within the fluid collection. Frank pus was aspirated. FINDINGS: Imaging confirms placement of a 59 French drain into a pelvic abscess via right trans gluteal approach. IMPRESSION: Successful CT-guided right trans gluteal pelvic abscess drainage yielding pus. Electronically Signed   By: Jolaine Click M.D.   On: 01/31/2017 16:30    Anti-infectives: Anti-infectives    Start     Dose/Rate Route Frequency Ordered Stop   01/31/17 0800  piperacillin-tazobactam (ZOSYN) IVPB 3.375 g     3.375 g 12.5 mL/hr over 240 Minutes Intravenous Every 8 hours 01/31/17 0756     01/28/17 2100  piperacillin-tazobactam (ZOSYN) IVPB 3.375 g  Status:  Discontinued     3.375 g 12.5 mL/hr over 240 Minutes Intravenous Every  8 hours 01/28/17 1333 01/31/17 0756   01/28/17 1230  piperacillin-tazobactam (ZOSYN) IVPB 3.375 g     3.375 g 100 mL/hr over 30 Minutes Intravenous  Once 01/28/17 1219 01/29/17 0758      Assessment/Plan: Acute appendicitis with rupture and intra-abdominal abscesses S/P perc drainage by Dr. Grace Isaac 9/11: 10 Fr drain LLQ, 10 Fr Drain R mid-abdomen >> 600cc purulent fluid. S/P perc drainage by Dr. Bonnielee Haff 9/13: R transgluteal drain - follow CX; GS growing E Coli, gram positive cocci, GPR - afebrile, WBC trending down - continue IV abx - mobilize, IS  - PRN analgesics and antiemetics Has bowel function, try clamping NG   LOS: 5 days    Wilma Michaelson T 02/02/2017

## 2017-02-02 NOTE — Progress Notes (Signed)
PHARMACY - ADULT TOTAL PARENTERAL NUTRITION CONSULT NOTE   Pharmacy Consult for TPN Indication: enteral intolerance, paralytic ileus  Patient Measurements: Body mass index is 31.5 kg/m. Filed Weights   01/28/17 1102 01/30/17 1306  Weight: 175 lb (79.4 kg) 192 lb 3.9 oz (87.2 kg)    HPI: 21 yoM presented to [facility] ED on 9/10 from [facility] with abdominal pain x4 days, bloating, belching, hiccups, sweats, N/V, unable to tolerate PO intake.  NG tube was placed on 9/10.  Found to have acute ruptured appendicitis and intra-abdominal abscesses. S/p IR drainage on 9/11 with drains remaining in LLQ and R mid abdomen.  He remains at risk for needing exploratory laparotomy.  Pharmacy is consulted to dose dose TPN for enteral intolerance x6 days and paralytic ileus.  Significant events:  9/11: perc drain x2 >> 600cc purulent fluid. 9/13: transgluteal drain placed 9/14: patient reports BM/flatus 9/15: NGT clamped in AM; awaiting diet advancement   Insulin requirements past 24 hours: 2 units  Current Nutrition: ice chips  IVF: NS to 50 ml/hr on 9/14  Central access: PICC placed 9/12 TPN start date:  9/12  ASSESSMENT                                                                                                          Today:   Glucose: in goal range 100-150 (No hx DM) on sensitive SSI  Electrolytes: all now WNL (including corrected Ca), although K slightly below goal for ileus  Renal: stable WNL  LFTs: Tbili improved to normal, Albumin low, otherwise WNL  TGs: WNL  Prealbumin: low  NGT and drain output slightly increased from yesterday   NUTRITIONAL GOALS                                                                                             RD recs (9/12):  110-127 Protein, 2225-2460 Kcal  Clinimix 5/20 at a goal rate of 83 ml/hr + 20% fat emulsion 240 ml/day to provide: 100 g of protein and 2233 kCals per day meeting 91% of protein and 100% of kCal needs. -  Limiting TPN rate to 83 ml/hr (one full 2L bag) in order to minimize waste due to ongoing national IVF and TPN shortages.  Meeting > 90% of estimated needs.   PLAN                  - KCl 10 mEq IV x 2; will try to keep K >/= 4.0 and Mg >/= 2.0 with ileus  At 1800 today:  Continue Clinimix E 5/20 at goal rate of 83 ml/hr  20% fat emulsion at 20 ml/hr x12 hours/day  TPN to contain standard multivitamins and trace element daily.  Continue NS 50 ml/hr at 1800  Continue sensitive SSI with CBGs q8h - young patient and very stable; if no high/low readings tomorrow will d/c SSI/CBGs   TPN lab panels on Mondays & Thursdays.  BMET, Mag with AM labs.   Bernadene Person, PharmD, BCPS Pager: [phone] 02/02/2017, 8:26 AM

## 2017-02-03 LAB — BASIC METABOLIC PANEL
Anion gap: 7 (ref 5–15)
BUN: 11 mg/dL (ref 6–20)
CO2: 23 mmol/L (ref 22–32)
Calcium: 8.2 mg/dL — ABNORMAL LOW (ref 8.9–10.3)
Chloride: 105 mmol/L (ref 101–111)
Creatinine, Ser: 0.76 mg/dL (ref 0.61–1.24)
GFR calc Af Amer: >60 mL/min (ref >60)
GFR calc non Af Amer: >60 mL/min (ref >60)
GLUCOSE: 132 mg/dL — AB (ref 65–99)
POTASSIUM: 4.2 mmol/L (ref 3.5–5.1)
Sodium: 135 mmol/L (ref 135–145)

## 2017-02-03 LAB — AEROBIC/ANAEROBIC CULTURE W GRAM STAIN (SURGICAL/DEEP WOUND)

## 2017-02-03 LAB — GLUCOSE, CAPILLARY
Glucose-Capillary: 122 mg/dL — ABNORMAL HIGH (ref 65–99)
Glucose-Capillary: 129 mg/dL — ABNORMAL HIGH (ref 65–99)

## 2017-02-03 LAB — AEROBIC/ANAEROBIC CULTURE (SURGICAL/DEEP WOUND)

## 2017-02-03 LAB — MAGNESIUM: Magnesium: 2.1 mg/dL (ref 1.7–2.4)

## 2017-02-03 MED ORDER — M.V.I. ADULT IV INJ
INJECTION | INTRAVENOUS | Status: AC
  Administered 2017-02-03: 18:00:00 via INTRAVENOUS
  Filled 2017-02-03: qty 1992

## 2017-02-03 MED ORDER — FAT EMULSION 20 % IV EMUL
240.0000 mL | INTRAVENOUS | Status: AC
  Administered 2017-02-03: 240 mL via INTRAVENOUS
  Filled 2017-02-03: qty 250

## 2017-02-03 NOTE — Progress Notes (Signed)
Patient ID: Titan Karner, male   DOB: 25-Oct-1995, 21 y.o.   MRN: [id number]     Subjective: Feels about the same. Mild abdominal pain unchanged. Has not had any nausea or bloating with his NG clamped and did have another bowel movement and passing flatus.  Objective: Vital signs in last 24 hours: Temp:  [99 F (37.2 C)-99.4 F (37.4 C)] 99.4 F (37.4 C) (09/16 0530) Pulse Rate:  [77-90] 77 (09/16 0530) Resp:  [18-20] 20 (09/16 0530) BP: (111-135)/(57-84) 125/57 (09/16 0530) SpO2:  [99 %-100 %] 99 % (09/16 0530) Last BM Date: 02/02/17  Intake/Output from previous day: 09/15 0701 - 09/16 0700 In: 1141 [I.V.:996; IV Piggyback:100] Out: 4991 [Urine:4825; Emesis/NG output:150; Drains:15; Stool:1] Intake/Output this shift: No intake/output data recorded.  General appearance: alert, cooperative and no distress GI: Mild lower abdominal tenderness without guarding. Possible mild distention. Bowel sounds present. Drains with cloudy serous drainage.  Lab Results:   Recent Labs  02/02/17 0402  WBC 17.4*  HGB 10.0*  HCT 29.6*  PLT 501*   BMET  Recent Labs  02/02/17 0402 02/03/17 0411  NA 138 135  K 3.9 4.2  CL 108 105  CO2 25 23  GLUCOSE 128* 132*  BUN 9 11  CREATININE 0.74 0.76  CALCIUM 7.7* 8.2*     Studies/Results: No results found.  Anti-infectives: Anti-infectives    Start     Dose/Rate Route Frequency Ordered Stop   01/31/17 0800  piperacillin-tazobactam (ZOSYN) IVPB 3.375 g     3.375 g 12.5 mL/hr over 240 Minutes Intravenous Every 8 hours 01/31/17 0756     01/28/17 2100  piperacillin-tazobactam (ZOSYN) IVPB 3.375 g  Status:  Discontinued     3.375 g 12.5 mL/hr over 240 Minutes Intravenous Every 8 hours 01/28/17 1333 01/31/17 0756   01/28/17 1230  piperacillin-tazobactam (ZOSYN) IVPB 3.375 g     3.375 g 100 mL/hr over 30 Minutes Intravenous  Once 01/28/17 1219 01/29/17 0758      Assessment/Plan: Acute appendicitis with rupture and intra-abdominal  abscesses S/P perc drainage by Dr. Grace Isaac 9/11: 10 Fr drain LLQ, 10 Fr Drain R mid-abdomen >> 600cc purulent fluid. S/P perc drainage by Dr. Bonnielee Haff 9/13: R transgluteal drain - follow CX; GS growing E Coli, gram positive cocci, GPR - afebrile, WBC trending down - continue IV abx - mobilize, IS  - PRN analgesics and antiemetics Has bowel function and tolerated NG clamped. We will DC and start clear liquids. Repeat CT scan early this week.     LOS: 6 days    Raeya Merritts T 02/03/2017

## 2017-02-03 NOTE — Progress Notes (Signed)
PHARMACY - ADULT TOTAL PARENTERAL NUTRITION CONSULT NOTE   Pharmacy Consult for TPN Indication: enteral intolerance, paralytic ileus  Patient Measurements: Body mass index is 31.5 kg/m. Filed Weights   01/28/17 1102 01/30/17 1306  Weight: 175 lb (79.4 kg) 192 lb 3.9 oz (87.2 kg)    HPI: 21 yoM presented to [facility] ED on 9/10 from [facility] with abdominal pain x4 days, bloating, belching, hiccups, sweats, N/V, unable to tolerate PO intake.  NG tube was placed on 9/10.  Found to have acute ruptured appendicitis and intra-abdominal abscesses. S/p IR drainage on 9/11 with drains remaining in LLQ and R mid abdomen.  He remains at risk for needing exploratory laparotomy.  Pharmacy is consulted to dose dose TPN for enteral intolerance x6 days and paralytic ileus.  Significant events:  9/11: perc drain x2 >> 600cc purulent fluid. 9/13: transgluteal drain placed 9/14: patient reports BM/flatus 9/15: NGT clamped in AM; awaiting diet advancement 9/16: steady return of bowel function, BM, flatus. D/C NGT, starting CLD  Insulin requirements past 24 hours: 2 units  Current Nutrition: CLD, TPN as below  IVF: NS to 50 ml/hr on 9/14  Central access: PICC placed 9/12 TPN start date:  9/12  ASSESSMENT                                                                                                          Today:   Glucose: stable in goal range 100-150 (No hx DM) on sensitive SSI  Electrolytes: all now WNL (including corrected Ca); K and Mg at goal for ileus  Renal: stable WNL  LFTs: Tbili improved to normal, Albumin low, otherwise WNL  TGs: WNL  Prealbumin: low  NGT and drain output improved from yesterday   NUTRITIONAL GOALS                                                                                             Keep K >/= 4.0 and Mg >/= 2.0 with ileus  RD recs (9/12): 110-127 Protein, 2225-2460 Kcal >> Clinimix E 5/15 at 100 ml/hr  Clinimix 5/20 at a goal rate of 83  ml/hr + 20% fat emulsion 240 ml/day to provide: 100 g of protein and 2233 kCals per day meeting 91% of protein and 100% of kCal needs. ** Limiting TPN rate to 83 ml/hr (one full 2L bag) in order to minimize waste due to ongoing national IVF and TPN shortages.  Meeting > 90% of estimated needs. **   PLAN  At 1800 today:  Continue Clinimix E 5/20 at goal rate of 83 ml/hr  20% fat emulsion at 20 ml/hr x12 hours/day  F/u surgery recs for weaning once tolerating PO nutrition  TPN to contain standard multivitamins and trace element daily.  Continue NS 50 ml/hr at 1800  Will d/c CBG checks/SSI; stable with excellent BGs on goal TPN  TPN lab panels on Mondays & Thursdays.  F/u daily   Bernadene Person, PharmD, BCPS Pager: [phone] 02/03/2017, 8:27 AM

## 2017-02-04 LAB — COMPREHENSIVE METABOLIC PANEL
ALT: 93 U/L — AB (ref 17–63)
AST: 49 U/L — ABNORMAL HIGH (ref 15–41)
Albumin: 2.7 g/dL — ABNORMAL LOW (ref 3.5–5.0)
Alkaline Phosphatase: 103 U/L (ref 38–126)
Anion gap: 7 (ref 5–15)
BUN: 12 mg/dL (ref 6–20)
CHLORIDE: 106 mmol/L (ref 101–111)
CO2: 23 mmol/L (ref 22–32)
CREATININE: 0.77 mg/dL (ref 0.61–1.24)
Calcium: 8.5 mg/dL — ABNORMAL LOW (ref 8.9–10.3)
GFR calc non Af Amer: >60 mL/min (ref >60)
Glucose, Bld: 116 mg/dL — ABNORMAL HIGH (ref 65–99)
POTASSIUM: 4.3 mmol/L (ref 3.5–5.1)
SODIUM: 136 mmol/L (ref 135–145)
Total Bilirubin: 0.6 mg/dL (ref 0.3–1.2)
Total Protein: 6.8 g/dL (ref 6.5–8.1)

## 2017-02-04 LAB — CBC
HEMATOCRIT: 33.9 % — AB (ref 39.0–52.0)
HEMOGLOBIN: 11.4 g/dL — AB (ref 13.0–17.0)
MCH: 28.6 pg (ref 26.0–34.0)
MCHC: 33.6 g/dL (ref 30.0–36.0)
MCV: 85 fL (ref 78.0–100.0)
Platelets: 614 10*3/uL — ABNORMAL HIGH (ref 150–400)
RBC: 3.99 MIL/uL — AB (ref 4.22–5.81)
RDW: 13.6 % (ref 11.5–15.5)
WBC: 20.6 10*3/uL — AB (ref 4.0–10.5)

## 2017-02-04 LAB — DIFFERENTIAL
BASOS ABS: 0.1 10*3/uL (ref 0.0–0.1)
Basophils Relative: 1 %
Eosinophils Absolute: 0.4 10*3/uL (ref 0.0–0.7)
Eosinophils Relative: 2 %
LYMPHS ABS: 2.4 10*3/uL (ref 0.7–4.0)
LYMPHS PCT: 12 %
Monocytes Absolute: 1.4 10*3/uL — ABNORMAL HIGH (ref 0.1–1.0)
Monocytes Relative: 7 %
NEUTROS ABS: 16.3 10*3/uL — AB (ref 1.7–7.7)
Neutrophils Relative %: 79 %

## 2017-02-04 LAB — TRIGLYCERIDES: Triglycerides: 120 mg/dL (ref <150)

## 2017-02-04 LAB — PREALBUMIN: PREALBUMIN: 20.7 mg/dL (ref 18–38)

## 2017-02-04 LAB — MAGNESIUM: Magnesium: 2.1 mg/dL (ref 1.7–2.4)

## 2017-02-04 LAB — PHOSPHORUS: PHOSPHORUS: 4.6 mg/dL (ref 2.5–4.6)

## 2017-02-04 MED ORDER — TRACE MINERALS CR-CU-MN-SE-ZN 10-1000-500-60 MCG/ML IV SOLN
INTRAVENOUS | Status: AC
  Administered 2017-02-04: 17:00:00 via INTRAVENOUS
  Filled 2017-02-04: qty 1992

## 2017-02-04 MED ORDER — FAT EMULSION 20 % IV EMUL
240.0000 mL | INTRAVENOUS | Status: AC
  Administered 2017-02-04: 240 mL via INTRAVENOUS
  Filled 2017-02-04: qty 250

## 2017-02-04 MED ORDER — METRONIDAZOLE IN NACL 5-0.79 MG/ML-% IV SOLN
500.0000 mg | Freq: Three times a day (TID) | INTRAVENOUS | Status: DC
  Administered 2017-02-04 – 2017-02-11 (×21): 500 mg via INTRAVENOUS
  Filled 2017-02-04 (×21): qty 100

## 2017-02-04 MED ORDER — DEXTROSE 5 % IV SOLN
2.0000 g | INTRAVENOUS | Status: DC
  Administered 2017-02-04 – 2017-02-10 (×7): 2 g via INTRAVENOUS
  Filled 2017-02-04 (×7): qty 2

## 2017-02-04 NOTE — Progress Notes (Signed)
Nutrition Follow-up  INTERVENTION:   TPN per Pharmacy  RD will continue to monitor diet advancement  NUTRITION DIAGNOSIS:   Inadequate oral intake related to inability to eat as evidenced by NPO status.  Now on clear liquids.  GOAL:   Patient will meet greater than or equal to 90% of their needs  Progressing.  MONITOR:   Diet advancement, Weight trends, Labs, I & O's, Other (Comment) (TPN regimen)  ASSESSMENT:   21 year old white male with no previous past medical history who presented to [facility] emergency department [facility] with a chief complaint of 4 days of abdominal pain. The patient reports right lower quadrant abdominal pain that started 9/7. Pain described as sharp, constant right lower quadrant pain with intermittent radiation to central lower abdomen. Denies aggravating or alleviating factors. Associated symptoms include abdominal bloating, belching, hiccups, cold sweats, nausea, and vomiting. The patient reports a small loose bowel movement this morning but states bowel function has been irregular. He initially thought he had the flu and attempted to take a cold and flu medication that he got over-the-counter with little relief of symptoms. Due to persistent pain and intolerance of oral intake he presented to the emergency department for evaluation.  9/17: NGT removed over weekend. Patient continues to receive TPN: Clinimix E 5/20 at goal rate of 83 ml/hr w/ 20% ILE @ 20 ml/hr x 12 hours providing 2233 kcal and 100g protein.  Per surgery note, pt will remain on clear liquids today. TPN to continue.  Medications reviewed. Labs reviewed: CBGs: 122-129 TG: 120 mg/dL   Pharmacy note for 1/61 pending.  9/14 -Pt remains NPO with NGT in place.  -Surgery PA note from this AM states possible NGT clamping and advancement to CLD over the weekend.  -He denies nausea this AM and states some abdominal tenderness near drain sites but no overt  pain. No new weight since 9/12.  -Double lumen PICC and Pharmacy has ordered for TPN to increase to goal rate (as outlined below) today at 1800.   Diet Order:  Diet clear liquid Room service appropriate? Yes; Fluid consistency: Thin .TPN (CLINIMIX-E) Adult .TPN (CLINIMIX-E) Adult  Skin:  Reviewed, no issues  Last BM:  9/16  Height:   Ht Readings from Last 1 Encounters:  02/01/17  (1.676 m)    Weight:   Wt Readings from Last 1 Encounters:  01/30/17 192 lb 3.9 oz (87.2 kg)    Ideal Body Weight:  63.18 kg  BMI:  Body mass index is 31.5 kg/m.  Estimated Nutritional Needs:   Kcal:  2225-2460 (28-31 kcal/kg)  Protein:  110-127 grams (1.4-1.6 grams/kg)  Fluid:  >/= 2.2 L/day  EDUCATION NEEDS:   No education needs identified at this time  Tilda Franco, MS, RD, LDN Pager: [phone] After Hours Pager: [phone]

## 2017-02-04 NOTE — Progress Notes (Signed)
  Progress Note: General Surgery Service   Assessment/Plan: Patient Active Problem List   Diagnosis Date Noted  . Acute appendicitis with rupture 01/28/2017  S/P perc drainage by Dr. Grace Isaac 9/11: 10 Fr drain LLQ, 10 Fr Drain R mid-abdomen >> 600cc purulent fluid. S/P perc drainage by Dr. Bonnielee Haff 9/13: R transgluteal drain - follow CX; GS growing E Coli, gram positive cocci, GPR - WBC up today to 20 from 17, continue to monitor - continue IV abx - mobilize, IS  - PRN analgesics and antiemetics - continue clear liquids and TPN today - CT scan this week   LOS: 7 days  Chief Complaint/Subjective: Pain controlled, multiple small BMs, tolerating liquids, no n/v  Objective: Vital signs in last 24 hours: Temp:  [98.7 F (37.1 C)-100 F (37.8 C)] 98.7 F (37.1 C) (09/17 0604) Pulse Rate:  [63-100] 63 (09/17 0604) Resp:  [18-20] 18 (09/17 0604) BP: (113-138)/(55-67) 113/55 (09/17 0604) SpO2:  [98 %-100 %] 100 % (09/17 0604) Last BM Date: 02/03/17  Intake/Output from previous day: 09/16 0701 - 09/17 0700 In: 315 [P.O.:300] Out: 2086 [Urine:2000; Drains:85; Stool:1] Intake/Output this shift: No intake/output data recorded.  Lungs: CTAB  Cardiovascular: RRR  Abd: soft, slight distension, nontender, drains with minimal SS output  Extremities: no edema  Neuro: AOx4  Lab Results: CBC   Recent Labs  02/02/17 0402 02/04/17 0339  WBC 17.4* 20.6*  HGB 10.0* 11.4*  HCT 29.6* 33.9*  PLT 501* 614*   BMET  Recent Labs  02/03/17 0411 02/04/17 0339  NA 135 136  K 4.2 4.3  CL 105 106  CO2 23 23  GLUCOSE 132* 116*  BUN 11 12  CREATININE 0.76 0.77  CALCIUM 8.2* 8.5*   PT/INR No results for input(s): LABPROT, INR in the last 72 hours. ABG No results for input(s): PHART, HCO3 in the last 72 hours.  Invalid input(s): PCO2, PO2  Studies/Results:  Anti-infectives: Anti-infectives    Start     Dose/Rate Route Frequency Ordered Stop   01/31/17 0800   piperacillin-tazobactam (ZOSYN) IVPB 3.375 g     3.375 g 12.5 mL/hr over 240 Minutes Intravenous Every 8 hours 01/31/17 0756     01/28/17 2100  piperacillin-tazobactam (ZOSYN) IVPB 3.375 g  Status:  Discontinued     3.375 g 12.5 mL/hr over 240 Minutes Intravenous Every 8 hours 01/28/17 1333 01/31/17 0756   01/28/17 1230  piperacillin-tazobactam (ZOSYN) IVPB 3.375 g     3.375 g 100 mL/hr over 30 Minutes Intravenous  Once 01/28/17 1219 01/29/17 0758      Medications: Scheduled Meds: . heparin subcutaneous  5,000 Units Subcutaneous Q8H  . ondansetron (ZOFRAN) IV  4 mg Intravenous Once  . sodium chloride flush  5 mL Per Tube Q8H  . sodium chloride flush  5 mL Intravenous Q8H   Continuous Infusions: . Marland KitchenTPN (CLINIMIX-E) Adult 83 mL/hr at 02/03/17 1814  . sodium chloride 50 mL/hr at 02/03/17 1103  . methocarbamol (ROBAXIN)  IV 500 mg (02/04/17 0823)  . piperacillin-tazobactam (ZOSYN)  IV 3.375 g (02/04/17 0534)   PRN Meds:.acetaminophen, diphenhydrAMINE **OR** diphenhydrAMINE, HYDROmorphone (DILAUDID) injection, iopamidol, ondansetron **OR** ondansetron (ZOFRAN) IV, phenol, sodium chloride flush  Rodman Pickle, MD Pg# [phone] [facility]

## 2017-02-04 NOTE — Progress Notes (Signed)
PHARMACY - ADULT TOTAL PARENTERAL NUTRITION CONSULT NOTE   Pharmacy Consult for TPN Indication: enteral intolerance, paralytic ileus  Patient Measurements: Body mass index is 31.5 kg/m. Filed Weights   01/28/17 1102 01/30/17 1306  Weight: 175 lb (79.4 kg) 192 lb 3.9 oz (87.2 kg)    HPI: 21 yoM presented to [facility] ED on 9/10 from [facility] with abdominal pain x4 days, bloating, belching, hiccups, sweats, N/V, unable to tolerate PO intake.  NG tube was placed on 9/10.  Found to have acute ruptured appendicitis and intra-abdominal abscesses. S/p IR drainage on 9/11 with drains remaining in LLQ and R mid abdomen.  He remains at risk for needing exploratory laparotomy.  Pharmacy is consulted to dose dose TPN for enteral intolerance x6 days and paralytic ileus.  Significant events:  9/11: perc drain x2 >> 600cc purulent fluid. 9/13: transgluteal drain placed 9/14: patient reports BM/flatus 9/15: NGT clamped in AM; awaiting diet advancement 9/16: steady return of bowel function, BM, flatus. D/C NGT, starting CLD 9/17: continue CL diet, still with low grade temps, WBC increased.   Insulin requirements past 24 hours: Novolog correction scale discontinued 9/16  Current Nutrition: CLD, TPN as below  IVF: NS to 50 ml/hr on 9/14  Central access: PICC placed 9/12 TPN start date:  9/12  ASSESSMENT                                                                                                          Today:   Glucose: stable in goal range 100-150 (No hx DM) on sensitive SSI  Electrolytes: all now WNL (including corrected Ca 9.3); Phos at high end of normal  Renal: stable WNL  LFTs: Tbili wnl, AST/ALT slightly increased, Albumin low, otherwise WNL  TGs: WNL  Prealbumin: 5.0 > 20.6  NGT out, drain output 60 ml/24 hr  Patient stated not hungry, encouraged intake despite not being hungry   NUTRITIONAL GOALS                                                                                              Keep K >/= 4.0 and Mg >/= 2.0 with ileus (4.3 and 2.1 today)  RD recs (9/12): 110-127 Protein, 2225-2460 Kcal >> Clinimix E 5/15 at 100 ml/hr  Clinimix 5/20 at a goal rate of 83 ml/hr + 20% fat emulsion 240 ml/day to provide: 100 g of protein and 2233 kCals per day meeting 91% of protein and 100% of kCal needs. ** Limiting TPN rate to 83 ml/hr (one full 2L bag) in order to minimize waste due to ongoing national IVF and TPN shortages.  Meeting > 90% of estimated needs. **  PLAN                                                                                                                        At 1800 today:  Continue Clinimix E 5/20 at goal rate of 83 ml/hr  20% fat emulsion at 20 ml/hr x12 hours/day  F/u surgery recs for weaning once tolerating PO nutrition  TPN to contain standard multivitamins and trace element daily.  Continue NS 50 ml/hr at 1800  TPN lab panels on Mondays & Thursdays.  BMET, Magnesium, Phosphorus levels in am  F/u daily  Otho Bellows PharmD Pager [phone] 02/04/2017, 2:13 PM

## 2017-02-04 NOTE — Progress Notes (Signed)
    Referring Physician(s):  Dr. Luretha Murphy  Supervising Physician: Irish Lack  Patient Status:  [facility] - In-pt  Chief Complaint:  Appendiceal abscess  Subjective: Patient sore, but overall feeling better since he has been able to tolerate sips of clears.   Allergies: Patient has no known allergies.  Medications: Prior to Admission medications   Medication Sig Start Date End Date Taking? Authorizing Provider  ibuprofen (ADVIL,MOTRIN) 200 MG tablet Take 400 mg by mouth every 6 (six) hours as needed for moderate pain or cramping.   Yes [provider]     Vital Signs: BP (!) 111/49 (BP Location: Left Arm)   Pulse 79   Temp 98.9 F (37.2 C) (Oral)   Resp 18   Ht  (1.676 m)   Wt 192 lb 3.9 oz (87.2 kg)   SpO2 100%   BMI 31.50 kg/m   Physical Exam  NAD, alert Abd:  LLQ drain with minimal sero-sanguinous output. Site intact, without erythema or warmth. Transgluteal drain with minimal serosanguinous output.  Site intact without erythema or warmth.  Right mid abdominal drain with minimal serous output. Site intact.   Imaging: No results found.  Labs:  CBC:  Recent Labs  01/30/17 0848 01/31/17 0459 02/02/17 0402 02/04/17 0339  WBC 17.3* 19.0* 17.4* 20.6*  HGB 11.8* 10.9* 10.0* 11.4*  HCT 34.8* 32.0* 29.6* 33.9*  PLT 339 413* 501* 614*    COAGS:  Recent Labs  01/29/17 0452  INR 1.15    BMP:  Recent Labs  02/01/17 0523 02/02/17 0402 02/03/17 0411 02/04/17 0339  NA 132* 138 135 136  K 3.8 3.9 4.2 4.3  CL 100* 108 105 106  CO2 GLUCOSE 131* 128* 132* 116*  BUN CALCIUM 7.7* 7.7* 8.2* 8.5*  CREATININE 0.78 0.74 0.76 0.77  GFRNONAA >60 >60 >60 >60  GFRAA >60 >60 >60 >60    LIVER FUNCTION TESTS:  Recent Labs  01/28/17 1120 01/31/17 0459 02/04/17 0339  BILITOT 1.8* 0.8 0.6  AST 31 41 49*  ALT 34 45 93*  ALKPHOS 79 69 103  PROT 8.0 5.8* 6.8  ALBUMIN 3.6 2.3* 2.7*    Assessment and  Plan: Appendiceal abscess s/p right mid abdomen drain and LLQ drain placement 01/29/17, and transgluteal drain placement 9/13.  Patient states he is feeling better.  Happy to be drinking some clear liquids. Continues TNA for now.  WBC elevated to 20.6 today.  Tmax overnight was 100.0 Cultures 9/11 shows E coli, cultures from 9/13 also showed E coli Discussed case with Dr. Fredia Sorrow and surgery.  Repeat CT Abdomen tomorrow to assess drain status.  Will follow results of scan.  Electronically Signed: Hoyt Koch, PA 02/04/2017, 3:45 PM   I spent a total of 15 Minutes at the the patient's bedside AND on the patient's hospital floor or unit, greater than 50% of which was counseling/coordinating care for appendiceal abscess

## 2017-02-05 ENCOUNTER — Inpatient Hospital Stay (HOSPITAL_COMMUNITY): Payer: BLUE CROSS/BLUE SHIELD

## 2017-02-05 LAB — BASIC METABOLIC PANEL
Anion gap: 8 (ref 5–15)
BUN: 11 mg/dL (ref 6–20)
CALCIUM: 8.5 mg/dL — AB (ref 8.9–10.3)
CHLORIDE: 103 mmol/L (ref 101–111)
CO2: 24 mmol/L (ref 22–32)
CREATININE: 0.75 mg/dL (ref 0.61–1.24)
GFR calc Af Amer: >60 mL/min (ref >60)
GFR calc non Af Amer: >60 mL/min (ref >60)
GLUCOSE: 124 mg/dL — AB (ref 65–99)
Potassium: 4 mmol/L (ref 3.5–5.1)
SODIUM: 135 mmol/L (ref 135–145)

## 2017-02-05 LAB — MAGNESIUM: Magnesium: 2 mg/dL (ref 1.7–2.4)

## 2017-02-05 LAB — PHOSPHORUS: PHOSPHORUS: 4.4 mg/dL (ref 2.5–4.6)

## 2017-02-05 MED ORDER — SODIUM CHLORIDE 0.9% FLUSH
10.0000 mL | Freq: Three times a day (TID) | INTRAVENOUS | Status: DC
Start: 2017-02-05 — End: 2017-02-12
  Administered 2017-02-05 – 2017-02-11 (×3): 10 mL via INTRAVENOUS

## 2017-02-05 MED ORDER — TRACE MINERALS CR-CU-MN-SE-ZN 10-1000-500-60 MCG/ML IV SOLN
INTRAVENOUS | Status: AC
  Administered 2017-02-05: 19:00:00 via INTRAVENOUS
  Filled 2017-02-05: qty 1992

## 2017-02-05 MED ORDER — MIDAZOLAM HCL 2 MG/2ML IJ SOLN
INTRAMUSCULAR | Status: AC
  Filled 2017-02-05: qty 2

## 2017-02-05 MED ORDER — FAT EMULSION 20 % IV EMUL
240.0000 mL | INTRAVENOUS | Status: AC
  Administered 2017-02-05: 240 mL via INTRAVENOUS
  Filled 2017-02-05: qty 250

## 2017-02-05 MED ORDER — LIDOCAINE HCL (PF) 1 % IJ SOLN
INTRAMUSCULAR | Status: AC | PRN
  Administered 2017-02-05: 15 mL

## 2017-02-05 MED ORDER — ENSURE ENLIVE PO LIQD
237.0000 mL | Freq: Two times a day (BID) | ORAL | Status: DC
  Administered 2017-02-07 – 2017-02-12 (×7): 237 mL via ORAL

## 2017-02-05 MED ORDER — FENTANYL CITRATE (PF) 100 MCG/2ML IJ SOLN
INTRAMUSCULAR | Status: AC
  Filled 2017-02-05: qty 2

## 2017-02-05 MED ORDER — HEPARIN SODIUM (PORCINE) 5000 UNIT/ML IJ SOLN
5000.0000 [IU] | Freq: Three times a day (TID) | INTRAMUSCULAR | Status: DC
  Administered 2017-02-05 – 2017-02-12 (×20): 5000 [IU] via SUBCUTANEOUS
  Filled 2017-02-05 (×21): qty 1

## 2017-02-05 MED ORDER — IOPAMIDOL (ISOVUE-300) INJECTION 61%
INTRAVENOUS | Status: AC
  Filled 2017-02-05: qty 100

## 2017-02-05 MED ORDER — FENTANYL CITRATE (PF) 100 MCG/2ML IJ SOLN
INTRAMUSCULAR | Status: AC | PRN
  Administered 2017-02-05 (×2): 50 ug via INTRAVENOUS

## 2017-02-05 MED ORDER — MIDAZOLAM HCL 2 MG/2ML IJ SOLN
INTRAMUSCULAR | Status: AC | PRN
  Administered 2017-02-05 (×2): 1 mg via INTRAVENOUS

## 2017-02-05 MED ORDER — IOPAMIDOL (ISOVUE-300) INJECTION 61%
100.0000 mL | Freq: Once | INTRAVENOUS | Status: AC | PRN
  Administered 2017-02-05: 100 mL via INTRAVENOUS

## 2017-02-05 NOTE — Progress Notes (Signed)
Patient ID: Adam Mcdowell, male   DOB: 05/14/1996, 21 y.o.   MRN: [id number]    Referring Physician(s): Dr. Luretha Murphy  Supervising Physician: Irish Lack  Patient Status: [facility] - In-pt  Chief Complaint: Perforated appendicitis with multiple intra-abdominal abscesses  Subjective: Patient denies abdominal pain.  Otherwise feels well.  Allergies: Patient has no known allergies.  Medications: Prior to Admission medications   Medication Sig Start Date End Date Taking? Authorizing Provider  ibuprofen (ADVIL,MOTRIN) 200 MG tablet Take 400 mg by mouth every 6 (six) hours as needed for moderate pain or cramping.   Yes [provider]    Vital Signs: BP 122/74 (BP Location: Left Arm)   Pulse 84   Temp 99.1 F (37.3 C) (Oral)   Resp 16   Ht  (1.676 m)   Wt 192 lb 3.9 oz (87.2 kg)   SpO2 100%   BMI 31.50 kg/m   Physical Exam: Heart: regular Lungs: CTAB Abd: LLQ and TG drains were removed without difficulty.  Dry dressings placed.  RLQ drain to gravity bag with no output currently.  Imaging: Ct Abdomen Pelvis W Contrast  Result Date: 02/05/2017 CLINICAL DATA:  Inpatient. Acute ruptured appendicitis complicated by multiple intra-abdominal abscesses requiring multiple percutaneous drains, presenting for follow-up. Generalized abdominal pain and tenderness. EXAM: CT ABDOMEN AND PELVIS WITH CONTRAST TECHNIQUE: Multidetector CT imaging of the abdomen and pelvis was performed using the standard protocol following bolus administration of intravenous contrast. CONTRAST:  ISOVUE-300 IOPAMIDOL (ISOVUE-300) INJECTION 61% COMPARISON:  01/31/2017 CT abdomen/ pelvis. FINDINGS: Lower chest: Small dependent basilar right pleural effusion, stable. Stable mild compressive atelectasis in the dependent right lower lobe. Resolved left pleural effusion. Hepatobiliary: Normal liver with no liver mass. Normal gallbladder with Phrygian cap, with no radiopaque cholelithiasis. No  biliary ductal dilatation. Pancreas: Normal, with no mass or duct dilation. Spleen: Normal size. No mass. Adrenals/Urinary Tract: Normal adrenals. Normal kidneys with no hydronephrosis and no renal mass. Normal bladder. Stomach/Bowel: Grossly normal stomach. Mildly dilated small bowel loops throughout the abdomen, overall decreased in caliber in the interval. No discrete small bowel caliber transition. Stable reactive wall thickening in the distal ileum adjacent to the persistently inflamed perforated appendix, which is dilated up to 13 mm diameter with thick enhancing wall and periappendiceal fat stranding. Mild reactive wall thickening in the distal sigmoid colon, decreased. No new sites of large bowel wall thickening. Scattered fluid levels throughout the proximal and transverse colon. Vascular/Lymphatic: Normal caliber abdominal aorta. Patent portal, splenic, hepatic and renal veins. Stable mildly enlarged right mesenteric lymph nodes up to 1.2 cm (series 2/ image 47). No new pathologically enlarged abdominopelvic nodes. Reproductive: Normal size prostate. Other: Right lateral abdominal wall percutaneous drainage catheter terminates within the posterior aspect of an 8.0 x 4.3 x 15.4 cm gas containing right infrahepatic abscess with thick enhancing wall (series 2/ image 45), previously 8.6 x 6.5 x 19.3 cm using similar measurement technique, mildly decreased. Central pelvic 4.2 x 2.6 x 2.1 cm abscess with thick enhancing wall, located superior to the bladder (series 2/image 70), previously 5.1 x 2.7 x 2.0 cm, minimally decreased. Ventral left lower abdominal wall percutaneous drain is stable in position with no recurrent fluid collection associated with this catheter. No residual fluid collection in the deep pelvis between the bladder and rectum status post transgluteal percutaneous drainage catheter placement. Musculoskeletal: No aggressive appearing focal osseous lesions. IMPRESSION: 1. Resolved deep pelvic  abscess between the bladder and rectum status post transgluteal percutaneous drainage catheter  placement. 2. No recurrence of the previously drained left lower quadrant abscess with stable position of the associated percutaneous drain. 3. Slightly decreased size of thick-walled abscess in the central pelvis superior to the bladder. 4. Irregular gas containing right infrahepatic abscess is mildly decreased in size, with associated percutaneous drain terminating in the posterior aspect of the abscess. 5. Stable small dependent right basilar pleural effusion. Resolved left pleural effusion. 6. Persistently inflamed perforated appendix in the right lower quadrant. 7. Improving adynamic ileus of the small and large bowel. 8. Stable mild right mesenteric adenopathy, probably reactive. Electronically Signed   By: Delbert Phenix M.D.   On: 02/05/2017 11:10    Labs:  CBC:  Recent Labs  01/30/17 0848 01/31/17 0459 02/02/17 0402 02/04/17 0339  WBC 17.3* 19.0* 17.4* 20.6*  HGB 11.8* 10.9* 10.0* 11.4*  HCT 34.8* 32.0* 29.6* 33.9*  PLT 339 413* 501* 614*    COAGS:  Recent Labs  01/29/17 0452  INR 1.15    BMP:  Recent Labs  02/02/17 0402 02/03/17 0411 02/04/17 0339 02/05/17 0421  NA 138 135 136 135  K 3.9 4.2 4.3 4.0  CL 108 105 106 103  CO2 GLUCOSE 128* 132* 116* 124*  BUN CALCIUM 7.7* 8.2* 8.5* 8.5*  CREATININE 0.74 0.76 0.77 0.75  GFRNONAA >60 >60 >60 >60  GFRAA >60 >60 >60 >60    LIVER FUNCTION TESTS:  Recent Labs  01/28/17 1120 01/31/17 0459 02/04/17 0339  BILITOT 1.8* 0.8 0.6  AST 31 41 49*  ALT 34 45 93*  ALKPHOS 79 69 103  PROT 8.0 5.8* 6.8  ALBUMIN 3.6 2.3* 2.7*    Assessment and Plan: 1. Perforated appendicitis with multiple intra-abdominal fluid collections, s/p 3 drains  Repeat CT scan today shows resolution of fluid collections around LLQ and TG drains.  These were removed without incident.  The RLQ fluid collection is still  present.  The drain will need to be upsized and respositioned.  I have discussed this with Dr. Sheliah Hatch who is agreeable.  We will plan for this this afternoon.  Electronically Signed: Letha Cape 02/05/2017, 2:43 PM   I spent a total of 25 Minutes at the the patient's bedside AND on the patient's hospital floor or unit, greater than 50% of which was counseling/coordinating care for multiple intra-abdominal abscesses

## 2017-02-05 NOTE — Procedures (Signed)
Interventional Radiology Procedure Note  Procedure: Placement of new RLQ percutaneous abscess drain  Complications: None  Estimated Blood Loss: None  Pre-existing 10 Fr drain removed over guidewire.  Unable to divert guidewire into residual liquefied abscess using 5 Fr catheter.  New puncture of residual RLQ appendiceal abscess made with 18 G needle followed by placement of 12 Fr percutaneous drain.  Return of purulent fluid. New drain attached to suction bulb.  Jodi Marble. Fredia Sorrow, M.D Pager:  [phone]

## 2017-02-05 NOTE — Progress Notes (Signed)
Progress Note: General Surgery Service   Assessment/Plan: Patient Active Problem List   Diagnosis Date Noted  . Acute appendicitis with rupture 01/28/2017  S/P perc drainage by Dr. Grace Isaac 9/11: 10 Fr drain LLQ, 10 Fr Drain R mid-abdomen >> 600cc purulent fluid. S/P perc drainage by Dr. Bonnielee Haff 9/13: R transgluteal drain. CT 9/18 shows continued RUQ abscess despite drain in reasonable location - follow CX; GS growing E Coli, gram positive cocci, GPR - WBC up today to 20 from 17, continue to monitor - continue IV abx ceftriaxone/flagyl 9/17 - mobilize, IS  - PRN analgesics and antiemetics - continue clear liquids, ensure and TPN today -f/u IR recs      LOS: 8 days  Chief Complaint/Subjective: No pain, some unformed stools, no nausea  Objective: Vital signs in last 24 hours: Temp:  [98.9 F (37.2 C)-99.6 F (37.6 C)] 98.9 F (37.2 C) (09/18 0432) Pulse Rate:  [79-101] 96 (09/18 0432) Resp:  [16-18] 16 (09/18 0432) BP: (111-117)/(49-63) 117/62 (09/18 0432) SpO2:  [97 %-100 %] 97 % (09/18 0432) Last BM Date: 02/05/17  Intake/Output from previous day: 09/17 0701 - 09/18 0700 In: 6605 [P.O.:1480; I.V.:4605; IV Piggyback:510] Out: 2965 [Urine:2950; Drains:15] Intake/Output this shift: Total I/O In: 256.7 [I.V.:201.7; IV Piggyback:55] Out: 500 [Urine:500]  Lungs: CTAB  Cardiovascular: RRR  Abd: soft, TTP RUQ, drains with minimal output  Extremities: no edema  Neuro: AOx4  Lab Results: CBC   Recent Labs  02/04/17 0339  WBC 20.6*  HGB 11.4*  HCT 33.9*  PLT 614*   BMET  Recent Labs  02/04/17 0339 02/05/17 0421  NA 136 135  K 4.3 4.0  CL 106 103  CO2 23 24  GLUCOSE 116* 124*  BUN 12 11  CREATININE 0.77 0.75  CALCIUM 8.5* 8.5*   PT/INR No results for input(s): LABPROT, INR in the last 72 hours. ABG No results for input(s): PHART, HCO3 in the last 72 hours.  Invalid input(s): PCO2, PO2  Studies/Results:  Anti-infectives: Anti-infectives    Start     Dose/Rate Route Frequency Ordered Stop   02/04/17 1500  cefTRIAXone (ROCEPHIN) 2 g in dextrose 5 % 50 mL IVPB     2 g 100 mL/hr over 30 Minutes Intravenous Every 24 hours 02/04/17 1354     02/04/17 1400  metroNIDAZOLE (FLAGYL) IVPB 500 mg     500 mg 100 mL/hr over 60 Minutes Intravenous Every 8 hours 02/04/17 1354     01/31/17 0800  piperacillin-tazobactam (ZOSYN) IVPB 3.375 g  Status:  Discontinued     3.375 g 12.5 mL/hr over 240 Minutes Intravenous Every 8 hours 01/31/17 0756 02/04/17 1351   01/28/17 2100  piperacillin-tazobactam (ZOSYN) IVPB 3.375 g  Status:  Discontinued     3.375 g 12.5 mL/hr over 240 Minutes Intravenous Every 8 hours 01/28/17 1333 01/31/17 0756   01/28/17 1230  piperacillin-tazobactam (ZOSYN) IVPB 3.375 g     3.375 g 100 mL/hr over 30 Minutes Intravenous  Once 01/28/17 1219 01/29/17 0758      Medications: Scheduled Meds: . heparin subcutaneous  5,000 Units Subcutaneous Q8H  . iopamidol      . ondansetron (ZOFRAN) IV  4 mg Intravenous Once  . sodium chloride flush  5 mL Per Tube Q8H  . sodium chloride flush  5 mL Intravenous Q8H   Continuous Infusions: . Marland KitchenTPN (CLINIMIX-E) Adult 83 mL/hr at 02/04/17 1719  . Marland KitchenTPN (CLINIMIX-E) Adult     And  . fat emulsion    . sodium  chloride 50 mL/hr at 02/03/17 1103  . cefTRIAXone (ROCEPHIN)  IV Stopped (02/04/17 1809)  . methocarbamol (ROBAXIN)  IV Stopped (02/05/17 0720)  . metronidazole Stopped (02/05/17 0641)   PRN Meds:.acetaminophen, diphenhydrAMINE **OR** diphenhydrAMINE, HYDROmorphone (DILAUDID) injection, iopamidol, ondansetron **OR** ondansetron (ZOFRAN) IV, phenol, sodium chloride flush  Rodman Pickle, MD Pg# [phone] [facility]

## 2017-02-05 NOTE — Progress Notes (Signed)
PHARMACY - ADULT TOTAL PARENTERAL NUTRITION CONSULT NOTE   Pharmacy Consult for TPN Indication: enteral intolerance, paralytic ileus  Patient Measurements: Body mass index is 31.5 kg/m. Filed Weights   01/28/17 1102 01/30/17 1306  Weight: 175 lb (79.4 kg) 192 lb 3.9 oz (87.2 kg)    HPI: 21 yoM presented to [facility] ED on 9/10 from [facility] with abdominal pain x4 days, bloating, belching, hiccups, sweats, N/V, unable to tolerate PO intake.  NG tube was placed on 9/10.  Found to have acute ruptured appendicitis and intra-abdominal abscesses. S/p IR drainage on 9/11 with drains remaining in LLQ and R mid abdomen.  He remains at risk for needing exploratory laparotomy.  Pharmacy is consulted to dose dose TPN for enteral intolerance x6 days and paralytic ileus.  Significant events:  9/11: perc drain x2 >> 600cc purulent fluid. 9/13: transgluteal drain placed 9/14: patient reports BM/flatus 9/15: NGT clamped in AM; awaiting diet advancement 9/16: steady return of bowel function, BM, flatus. D/C NGT, starting CLD 9/17: continue CL diet, still with low grade temps, WBC increased.   Insulin requirements past 24 hours: Novolog correction scale discontinued 9/16  Current Nutrition: CLD, TPN as below  IVF: NS to 50 ml/hr   Central access: PICC placed 9/12 TPN start date:  9/12  ASSESSMENT                                                                                                          Today:   Glucose: stable in goal range 100-150 (No hx DM) on sensitive SSI  Electrolytes: all now WNL  Renal: stable WNL  LFTs: Tbili wnl, AST/ALT slightly increased, Albumin low, otherwise WNL  TGs: WNL  Prealbumin: 5.0 > 20.6  NGT out, drain output 60 ml/24 hr  Patient stated not hungry, encouraged intake despite not being hungry   NUTRITIONAL GOALS                                                                                             Keep K >/= 4.0 and Mg >/= 2.0 with  ileus (4.4 and 2.0 today)  RD recs (9/12): 110-127 Protein, 2225-2460 Kcal >> Clinimix E 5/15 at 100 ml/hr  Clinimix 5/20 at a goal rate of 83 ml/hr + 20% fat emulsion 240 ml/day to provide: 100 g of protein and 2233 kCals per day meeting 91% of protein and 100% of kCal needs. ** Limiting TPN rate to 83 ml/hr (one full 2L bag) in order to minimize waste due to ongoing national IVF and TPN shortages.  Meeting > 90% of estimated needs. **   PLAN  At 1800 today:  Continue Clinimix E 5/20 at goal rate of 83 ml/hr  20% fat emulsion at 20 ml/hr x12 hours/day  F/u surgery recs for weaning once tolerating PO nutrition  TPN to contain standard multivitamins and trace element daily.  Continue NS 50 ml/hr at 1800  TPN lab panels on Mondays & Thursdays.  BMET, Magnesium, Phosphorus levels in am  F/u daily   Hessie Knows, PharmD, BCPS Pager [phone] 02/05/2017 10:13 AM

## 2017-02-06 LAB — BASIC METABOLIC PANEL
ANION GAP: 8 (ref 5–15)
BUN: 16 mg/dL (ref 6–20)
CO2: 23 mmol/L (ref 22–32)
Calcium: 8.3 mg/dL — ABNORMAL LOW (ref 8.9–10.3)
Chloride: 103 mmol/L (ref 101–111)
Creatinine, Ser: 0.84 mg/dL (ref 0.61–1.24)
Glucose, Bld: 133 mg/dL — ABNORMAL HIGH (ref 65–99)
POTASSIUM: 4.2 mmol/L (ref 3.5–5.1)
SODIUM: 134 mmol/L — AB (ref 135–145)

## 2017-02-06 LAB — CBC
HCT: 34.1 % — ABNORMAL LOW (ref 39.0–52.0)
Hemoglobin: 11.2 g/dL — ABNORMAL LOW (ref 13.0–17.0)
MCH: 28.6 pg (ref 26.0–34.0)
MCHC: 32.8 g/dL (ref 30.0–36.0)
MCV: 87.2 fL (ref 78.0–100.0)
PLATELETS: 638 10*3/uL — AB (ref 150–400)
RBC: 3.91 MIL/uL — AB (ref 4.22–5.81)
RDW: 13.9 % (ref 11.5–15.5)
WBC: 25.7 10*3/uL — AB (ref 4.0–10.5)

## 2017-02-06 LAB — MAGNESIUM: MAGNESIUM: 2 mg/dL (ref 1.7–2.4)

## 2017-02-06 LAB — AEROBIC/ANAEROBIC CULTURE (SURGICAL/DEEP WOUND)

## 2017-02-06 LAB — PHOSPHORUS: PHOSPHORUS: 3.8 mg/dL (ref 2.5–4.6)

## 2017-02-06 LAB — AEROBIC/ANAEROBIC CULTURE W GRAM STAIN (SURGICAL/DEEP WOUND)

## 2017-02-06 MED ORDER — FAT EMULSION 20 % IV EMUL
240.0000 mL | INTRAVENOUS | Status: AC
  Administered 2017-02-06: 240 mL via INTRAVENOUS
  Filled 2017-02-06: qty 250

## 2017-02-06 MED ORDER — TRACE MINERALS CR-CU-MN-SE-ZN 10-1000-500-60 MCG/ML IV SOLN
INTRAVENOUS | Status: AC
  Administered 2017-02-06: 17:00:00 via INTRAVENOUS
  Filled 2017-02-06: qty 1992

## 2017-02-06 NOTE — Progress Notes (Signed)
PHARMACY - ADULT TOTAL PARENTERAL NUTRITION CONSULT NOTE   Pharmacy Consult for TPN Indication: enteral intolerance, paralytic ileus  Patient Measurements: Body mass index is 31.5 kg/m. Filed Weights   01/28/17 1102 01/30/17 1306  Weight: 175 lb (79.4 kg) 192 lb 3.9 oz (87.2 kg)    HPI: 21 yoM presented to [facility] ED on 9/10 from [facility] with abdominal pain x4 days, bloating, belching, hiccups, sweats, N/V, unable to tolerate PO intake.  NG tube was placed on 9/10.  Found to have acute ruptured appendicitis and intra-abdominal abscesses. S/p IR drainage on 9/11 with drains remaining in LLQ and R mid abdomen.  He remains at risk for needing exploratory laparotomy.  Pharmacy is consulted to dose dose TPN for enteral intolerance x6 days and paralytic ileus.  Significant events:  9/11: perc drain x2 >> 600cc purulent fluid. 9/13: transgluteal drain placed 9/14: patient reports BM/flatus 9/15: NGT clamped in AM; awaiting diet advancement 9/16: steady return of bowel function, BM, flatus. D/C NGT, starting CLD 9/17: continue CL diet, still with low grade temps, WBC increased.  9/19: cont CL diet, little po intake, abd drain enlarged, continues to drain  Insulin requirements past 24 hours: Novolog correction scale discontinued 9/16  Current Nutrition: CLD, TPN as betow  IVF: NS at 50 ml/hr   Central access: PICC placed 9/12 TPN start date:  9/12  ASSESSMENT                                                                                                          Today:   Glucose: stable in goal range 100-150 (No hx DM) SSI d/c   Electrolytes: Na 134, otherwise wnl  Renal: stable WNL  LFTs: Tbili wnl, AST/ALT slightly increased, Albumin low, otherwise WNL  TGs: WNL  Prealbumin: 5.0 > 20.6  NGT out, drain output 90 ml/24 hr  Patient stated not hungry, encouraged intake despite not being hungry   NUTRITIONAL GOALS                                                                                              Keep K >/= 4.0 and Mg >/= 2.0 with ileus (4.4 and 2.0 today)  RD recs (9/12): 110-127 Protein, 2225-2460 Kcal >> Clinimix E 5/15 at 100 ml/hr  Clinimix 5/20 at a goal rate of 83 ml/hr + 20% fat emulsion 240 ml/day to provide: 100 g of protein and 2233 kCals per day meeting 91% of protein and 100% of kCal needs. ** Limiting TPN rate to 83 ml/hr (one full 2L bag) in order to minimize waste due to ongoing national IVF and TPN shortages.  Meeting > 90% of estimated needs. **  PLAN                                                                                                                        At 1800 today:  Continue Clinimix E 5/20 at goal rate of 83 ml/hr  20% fat emulsion at 20 ml/hr x12 hours/day  F/u surgery recs for weaning once tolerating PO nutrition  TPN to contain standard multivitamins and trace element daily.  Continue NS 50 ml/hr   TPN lab panels on Mondays & Thursdays.  F/u daily   Otho Bellows PharmD Pager [phone] 02/06/2017, 2:47 PM

## 2017-02-06 NOTE — Progress Notes (Signed)
Referring Physician(s): Martin,M  Supervising Physician: Oley Balm  Patient Status:  [facility] - In-pt  Chief Complaint:  appendiceal abscess  Subjective: Pt doing ok this am; did have 1 episode of N/V after taking ensure earlier; persistent soreness RLQ; no chills/rigors; temp mildly elevated (100.5)   Allergies: Patient has no known allergies.  Medications: Prior to Admission medications   Medication Sig Start Date End Date Taking? Authorizing Provider  ibuprofen (ADVIL,MOTRIN) 200 MG tablet Take 400 mg by mouth every 6 (six) hours as needed for moderate pain or cramping.   Yes [provider]     Vital Signs: BP 118/62 (BP Location: Left Arm)   Pulse (!) 107   Temp (!) 100.5 F (38.1 C) (Oral)   Resp 20   Ht  (1.676 m)   Wt 192 lb 3.9 oz (87.2 kg)   SpO2 97%   BMI 31.50 kg/m   Physical Exam RLQ drain intact, output 130 cc feculent appearing fluid; insertion site ok, site mod tender  Imaging: Ct Abdomen Pelvis W Contrast  Result Date: 02/05/2017 CLINICAL DATA:  Inpatient. Acute ruptured appendicitis complicated by multiple intra-abdominal abscesses requiring multiple percutaneous drains, presenting for follow-up. Generalized abdominal pain and tenderness. EXAM: CT ABDOMEN AND PELVIS WITH CONTRAST TECHNIQUE: Multidetector CT imaging of the abdomen and pelvis was performed using the standard protocol following bolus administration of intravenous contrast. CONTRAST:  ISOVUE-300 IOPAMIDOL (ISOVUE-300) INJECTION 61% COMPARISON:  01/31/2017 CT abdomen/ pelvis. FINDINGS: Lower chest: Small dependent basilar right pleural effusion, stable. Stable mild compressive atelectasis in the dependent right lower lobe. Resolved left pleural effusion. Hepatobiliary: Normal liver with no liver mass. Normal gallbladder with Phrygian cap, with no radiopaque cholelithiasis. No biliary ductal dilatation. Pancreas: Normal, with no mass or duct dilation. Spleen: Normal  size. No mass. Adrenals/Urinary Tract: Normal adrenals. Normal kidneys with no hydronephrosis and no renal mass. Normal bladder. Stomach/Bowel: Grossly normal stomach. Mildly dilated small bowel loops throughout the abdomen, overall decreased in caliber in the interval. No discrete small bowel caliber transition. Stable reactive wall thickening in the distal ileum adjacent to the persistently inflamed perforated appendix, which is dilated up to 13 mm diameter with thick enhancing wall and periappendiceal fat stranding. Mild reactive wall thickening in the distal sigmoid colon, decreased. No new sites of large bowel wall thickening. Scattered fluid levels throughout the proximal and transverse colon. Vascular/Lymphatic: Normal caliber abdominal aorta. Patent portal, splenic, hepatic and renal veins. Stable mildly enlarged right mesenteric lymph nodes up to 1.2 cm (series 2/ image 47). No new pathologically enlarged abdominopelvic nodes. Reproductive: Normal size prostate. Other: Right lateral abdominal wall percutaneous drainage catheter terminates within the posterior aspect of an 8.0 x 4.3 x 15.4 cm gas containing right infrahepatic abscess with thick enhancing wall (series 2/ image 45), previously 8.6 x 6.5 x 19.3 cm using similar measurement technique, mildly decreased. Central pelvic 4.2 x 2.6 x 2.1 cm abscess with thick enhancing wall, located superior to the bladder (series 2/image 70), previously 5.1 x 2.7 x 2.0 cm, minimally decreased. Ventral left lower abdominal wall percutaneous drain is stable in position with no recurrent fluid collection associated with this catheter. No residual fluid collection in the deep pelvis between the bladder and rectum status post transgluteal percutaneous drainage catheter placement. Musculoskeletal: No aggressive appearing focal osseous lesions. IMPRESSION: 1. Resolved deep pelvic abscess between the bladder and rectum status post transgluteal percutaneous drainage catheter  placement. 2. No recurrence of the previously drained left lower quadrant abscess  with stable position of the associated percutaneous drain. 3. Slightly decreased size of thick-walled abscess in the central pelvis superior to the bladder. 4. Irregular gas containing right infrahepatic abscess is mildly decreased in size, with associated percutaneous drain terminating in the posterior aspect of the abscess. 5. Stable small dependent right basilar pleural effusion. Resolved left pleural effusion. 6. Persistently inflamed perforated appendix in the right lower quadrant. 7. Improving adynamic ileus of the small and large bowel. 8. Stable mild right mesenteric adenopathy, probably reactive. Electronically Signed   By: Delbert Phenix M.D.   On: 02/05/2017 11:10   Ct Image Guided Fluid Drain By Catheter  Result Date: 02/06/2017 INDICATION: Ruptured appendicitis and status post placement of 3 separate prior percutaneous drainage catheters to treat appendiceal and peritoneal abscesses. Two of the drains were removed earlier today. There is a residual appendiceal abscess in the right lower quadrant that is not fully drained with little output from the indwelling drainage catheter positioned within the posterior aspect of the collection. He now presents for repositioning/ upsizing of the drain versus placement of a new drain. EXAM: CT-GUIDED DRAINAGE OF APPENDICEAL ABSCESS BY NEW CATHETER PLACEMENT MEDICATIONS: The patient is currently admitted to the hospital and receiving intravenous antibiotics. The antibiotics were administered within an appropriate time frame prior to the initiation of the procedure. ANESTHESIA/SEDATION: Fentanyl 100 mcg IV; Versed 2.0 mg IV Moderate Sedation Time:  25 minutes. The patient was continuously monitored during the procedure by the interventional radiology nurse under my direct supervision. COMPLICATIONS: None immediate. PROCEDURE: Informed written consent was obtained from the patient after  a thorough discussion of the procedural risks, benefits and alternatives. All questions were addressed. Maximal Sterile Barrier Technique was utilized including caps, mask, sterile gowns, sterile gloves, sterile drape, hand hygiene and skin antiseptic. A timeout was performed prior to the initiation of the procedure. The right side was rolled up slightly and additional imaging performed of the pre-existing right lower quadrant drainage catheter and adjacent abscess. The catheter was prepped with Betadine. A guidewire was advanced through the drainage catheter in the drainage catheter removed. A 5 French catheter was then advanced over the wire and attempts made to re-direct the wire. A site was chosen for new access of a residual right lower quadrant peritoneal abscess anterior and superior to the pre-existing drainage catheter site. An 18 gauge trocar needle was advanced under CT guidance at this level into the residual abscess. A guidewire was advanced through the needle. The tract was dilated over the wire and a 12 French percutaneous drain placed. The drain was further retracted under CT guidance into the abscess cavity. The drainage catheter was connected to a suction bulb. It was secured at the skin with a Prolene retention suture and StatLock device. The 5 French catheter and guidewire positioned at the level of the previously placed drain were removed. FINDINGS: After removal of the previously placed drainage catheter over a guidewire, it became evident that the guidewire was persistently conforming to the course of the drainage catheter and could not be repositioned even with manipulation with a 5 French catheter. New puncture of the residual abscess therefore had to be performed in order to provide adequate percutaneous drainage. The previous percutaneous access was abandoned. After puncture of the residual abscess at a new location, there was immediate return of purulent fluid. A 12 French drain was  placed and there is good return of fluid from the drain after placement. IMPRESSION: Placement of new 1 French percutaneous drain  in residual undrained right lower quadrant appendiceal abscess. The previously placed drain was removed over a guidewire and guidewire position could not be manipulated into the residual undrained abscess. Electronically Signed   By: Irish Lack M.D.   On: 02/06/2017 07:42    Labs:  CBC:  Recent Labs  01/31/17 0459 02/02/17 0402 02/04/17 0339 02/06/17 0453  WBC 19.0* 17.4* 20.6* 25.7*  HGB 10.9* 10.0* 11.4* 11.2*  HCT 32.0* 29.6* 33.9* 34.1*  PLT 413* 501* 614* 638*    COAGS:  Recent Labs  01/29/17 0452  INR 1.15    BMP:  Recent Labs  02/03/17 0411 02/04/17 0339 02/05/17 0421 02/06/17 0453  NA 135 136 135 134*  K 4.2 4.3 4.0 4.2  CL 105 106 103 103  CO2 GLUCOSE 132* 116* 124* 133*  BUN CALCIUM 8.2* 8.5* 8.5* 8.3*  CREATININE 0.76 0.77 0.75 0.84  GFRNONAA >60 >60 >60 >60  GFRAA >60 >60 >60 >60    LIVER FUNCTION TESTS:  Recent Labs  01/28/17 1120 01/31/17 0459 02/04/17 0339  BILITOT 1.8* 0.8 0.6  AST 31 41 49*  ALT 34 45 93*  ALKPHOS 79 69 103  PROT 8.0 5.8* 6.8  ALBUMIN 3.6 2.3* 2.7*    Assessment and Plan: Pt with hx ruptured appendicitis/intra abd abscesses; s/p  R/L abd drains x2 9/11; transgluteal drain 9/13; left/TG drains removed 9/18; previous RLQ drain removed and new one placed 9/18; temp 100.5; WBC 25.7(20.6), hgb stable, creat nl; prev fluid cx- pan sensitive e coli; cont current tx; will need f/u CT/drain injection within 1 week or sooner if clinical status warrants   Electronically Signed: D. Jeananne Rama, PA-C 02/06/2017, 9:33 AM   I spent a total of 15 minutes at the the patient's bedside AND on the patient's hospital floor or unit, greater than 50% of which was counseling/coordinating care for pelvic abscess drain    Patient ID: Adam Mcdowell, male   DOB: 25-May-1995, 21  y.o.   MRN: [id number]

## 2017-02-06 NOTE — Progress Notes (Signed)
Progress Note: General Surgery Service   Assessment/Plan: Patient Active Problem List   Diagnosis Date Noted  . Acute appendicitis with rupture 01/28/2017  S/P perc drainage by Dr. Grace Isaac 9/11: 10 Fr drain LLQ, 10 Fr Drain R mid-abdomen >> 600cc purulent fluid. S/P perc drainage by Dr. Bonnielee Haff 9/13: R transgluteal drain. CT 9/18 shows continued RUQ abscess despite drain in reasonable location, RUQ drain upsized 9/18 - follow CX; GS growing E Coli, gram positive cocci, GPR - continue IV abx ceftriaxone/flagyl 9/17 - mobilize, IS  - PRN analgesics and antiemetics - continue clear liquids, ensure and TPN today -f/u IR recs   LOS: 9 days  Chief Complaint/Subjective: Pain stable, vomiting this morning  Objective: Vital signs in last 24 hours: Temp:  [98.6 F (37 C)-100.5 F (38.1 C)] 100.5 F (38.1 C) (09/19 0531) Pulse Rate:  [82-113] 107 (09/19 0531) Resp:  [16-23] 20 (09/19 0531) BP: (110-122)/(61-80) 118/62 (09/19 0531) SpO2:  [94 %-100 %] 97 % (09/19 0531) Last BM Date: 02/05/17  Intake/Output from previous day: 09/18 0701 - 09/19 0700 In: 4220 [P.O.:560; I.V.:3090; IV Piggyback:570] Out: 1255 [Urine:1125; Drains:130] Intake/Output this shift: Total I/O In: 5 [Other:5] Out: 110 [Urine:100; Drains:10]  Lungs: CTAb  Cardiovascular: RRR  Abd: soft, TTP RUQ, no distension, purulent drainage from drain  Extremities: no edema  Neuro: AOx4  Lab Results: CBC   Recent Labs  02/04/17 0339 02/06/17 0453  WBC 20.6* 25.7*  HGB 11.4* 11.2*  HCT 33.9* 34.1*  PLT 614* 638*   BMET  Recent Labs  02/05/17 0421 02/06/17 0453  NA 135 134*  K 4.0 4.2  CL 103 103  CO2 24 23  GLUCOSE 124* 133*  BUN 11 16  CREATININE 0.75 0.84  CALCIUM 8.5* 8.3*   PT/INR No results for input(s): LABPROT, INR in the last 72 hours. ABG No results for input(s): PHART, HCO3 in the last 72 hours.  Invalid input(s): PCO2, PO2  Studies/Results:  Anti-infectives: Anti-infectives      Start     Dose/Rate Route Frequency Ordered Stop   02/04/17 1500  cefTRIAXone (ROCEPHIN) 2 g in dextrose 5 % 50 mL IVPB     2 g 100 mL/hr over 30 Minutes Intravenous Every 24 hours 02/04/17 1354     02/04/17 1400  metroNIDAZOLE (FLAGYL) IVPB 500 mg     500 mg 100 mL/hr over 60 Minutes Intravenous Every 8 hours 02/04/17 1354     01/31/17 0800  piperacillin-tazobactam (ZOSYN) IVPB 3.375 g  Status:  Discontinued     3.375 g 12.5 mL/hr over 240 Minutes Intravenous Every 8 hours 01/31/17 0756 02/04/17 1351   01/28/17 2100  piperacillin-tazobactam (ZOSYN) IVPB 3.375 g  Status:  Discontinued     3.375 g 12.5 mL/hr over 240 Minutes Intravenous Every 8 hours 01/28/17 1333 01/31/17 0756   01/28/17 1230  piperacillin-tazobactam (ZOSYN) IVPB 3.375 g     3.375 g 100 mL/hr over 30 Minutes Intravenous  Once 01/28/17 1219 01/29/17 0758      Medications: Scheduled Meds: . feeding supplement (ENSURE ENLIVE)  237 mL Oral BID BM  . heparin subcutaneous  5,000 Units Subcutaneous Q8H  . ondansetron (ZOFRAN) IV  4 mg Intravenous Once  . sodium chloride flush  10 mL Intravenous Q8H  . sodium chloride flush  5 mL Per Tube Q8H  . sodium chloride flush  5 mL Intravenous Q8H   Continuous Infusions: . Marland KitchenTPN (CLINIMIX-E) Adult 83 mL/hr at 02/05/17 1852  . sodium chloride 50 mL/hr at  02/03/17 1103  . cefTRIAXone (ROCEPHIN)  IV Stopped (02/05/17 1716)  . methocarbamol (ROBAXIN)  IV Stopped (02/06/17 4098)  . metronidazole 500 mg (02/06/17 1191)   PRN Meds:.acetaminophen, diphenhydrAMINE **OR** diphenhydrAMINE, HYDROmorphone (DILAUDID) injection, iopamidol, ondansetron **OR** ondansetron (ZOFRAN) IV, phenol, sodium chloride flush  Rodman Pickle, MD Pg# [phone] [facility]

## 2017-02-07 LAB — CBC WITH DIFFERENTIAL/PLATELET
BASOS PCT: 0 %
Basophils Absolute: 0 10*3/uL (ref 0.0–0.1)
EOS ABS: 0.2 10*3/uL (ref 0.0–0.7)
Eosinophils Relative: 1 %
HCT: 33.8 % — ABNORMAL LOW (ref 39.0–52.0)
Hemoglobin: 11.3 g/dL — ABNORMAL LOW (ref 13.0–17.0)
LYMPHS ABS: 2.4 10*3/uL (ref 0.7–4.0)
Lymphocytes Relative: 10 %
MCH: 28.5 pg (ref 26.0–34.0)
MCHC: 33.4 g/dL (ref 30.0–36.0)
MCV: 85.4 fL (ref 78.0–100.0)
MONO ABS: 1.4 10*3/uL — AB (ref 0.1–1.0)
Monocytes Relative: 6 %
Neutro Abs: 19.6 10*3/uL — ABNORMAL HIGH (ref 1.7–7.7)
Neutrophils Relative %: 83 %
PLATELETS: 635 10*3/uL — AB (ref 150–400)
RBC: 3.96 MIL/uL — ABNORMAL LOW (ref 4.22–5.81)
RDW: 13.8 % (ref 11.5–15.5)
WBC: 23.6 10*3/uL — AB (ref 4.0–10.5)

## 2017-02-07 LAB — COMPREHENSIVE METABOLIC PANEL
ALK PHOS: 80 U/L (ref 38–126)
ALT: 38 U/L (ref 17–63)
ANION GAP: 6 (ref 5–15)
AST: 18 U/L (ref 15–41)
Albumin: 2.7 g/dL — ABNORMAL LOW (ref 3.5–5.0)
BILIRUBIN TOTAL: 0.5 mg/dL (ref 0.3–1.2)
BUN: 12 mg/dL (ref 6–20)
CALCIUM: 8.1 mg/dL — AB (ref 8.9–10.3)
CO2: 25 mmol/L (ref 22–32)
Chloride: 103 mmol/L (ref 101–111)
Creatinine, Ser: 0.77 mg/dL (ref 0.61–1.24)
GFR calc Af Amer: >60 mL/min (ref >60)
Glucose, Bld: 136 mg/dL — ABNORMAL HIGH (ref 65–99)
Potassium: 3.7 mmol/L (ref 3.5–5.1)
Sodium: 134 mmol/L — ABNORMAL LOW (ref 135–145)
TOTAL PROTEIN: 6.4 g/dL — AB (ref 6.5–8.1)

## 2017-02-07 LAB — PHOSPHORUS: Phosphorus: 4.1 mg/dL (ref 2.5–4.6)

## 2017-02-07 LAB — MAGNESIUM: MAGNESIUM: 2 mg/dL (ref 1.7–2.4)

## 2017-02-07 MED ORDER — FAT EMULSION 20 % IV EMUL
240.0000 mL | INTRAVENOUS | Status: AC
  Administered 2017-02-07: 240 mL via INTRAVENOUS
  Filled 2017-02-07: qty 250

## 2017-02-07 MED ORDER — M.V.I. ADULT IV INJ
INJECTION | INTRAVENOUS | Status: AC
  Administered 2017-02-07: 18:00:00 via INTRAVENOUS
  Filled 2017-02-07: qty 1440

## 2017-02-07 MED ORDER — POTASSIUM CHLORIDE 10 MEQ/100ML IV SOLN
10.0000 meq | INTRAVENOUS | Status: AC
  Administered 2017-02-07 (×2): 10 meq via INTRAVENOUS
  Filled 2017-02-07 (×2): qty 100

## 2017-02-07 NOTE — Progress Notes (Signed)
Progress Note: General Surgery Service   Assessment/Plan: Patient Active Problem List   Diagnosis Date Noted  . Acute appendicitis with rupture 01/28/2017  S/P perc drainage by Dr. Grace Isaac 9/11: 10 Fr drain LLQ, 10 Fr Drain R mid-abdomen >> 600cc purulent fluid. S/P perc drainage by Dr. Bonnielee Haff 9/13: R transgluteal drain. CT 9/18 shows continued RUQ abscess despite drain in reasonable location, RUQ drain upsized 9/18 - follow CX; GS growing E Coli, gram positive cocci, GPR - continue IV abx ceftriaxone/flagyl 9/17 - mobilize, IS  - PRN analgesics and antiemetics Full liquids Begin TPN wean F/u CBC today -f/u IR recs   LOS: 10 days  Chief Complaint/Subjective: No pain, some soreness left back  Objective: Vital signs in last 24 hours: Temp:  [98.9 F (37.2 C)-100.4 F (38 C)] 99.4 F (37.4 C) (09/20 0542) Pulse Rate:  [91-99] 99 (09/20 0542) Resp:  [18-20] 18 (09/20 0542) BP: (112-122)/(61-68) 119/62 (09/20 0542) SpO2:  [98 %-99 %] 98 % (09/20 0542) Last BM Date: 02/05/17  Intake/Output from previous day: 09/19 0701 - 09/20 0700 In: 2786.2 [I.V.:2466.2; IV Piggyback:310] Out: 1470 [Urine:1425; Drains:45] Intake/Output this shift: No intake/output data recorded.  Lungs: CTAB  Cardiovascular: RRR  Abd: soft, NT, slight distension  Extremities: no edema  Neuro: AOx4  Lab Results: CBC   Recent Labs  02/06/17 0453  WBC 25.7*  HGB 11.2*  HCT 34.1*  PLT 638*   BMET  Recent Labs  02/06/17 0453 02/07/17 0409  NA 134* 134*  K 4.2 3.7  CL 103 103  CO2 23 25  GLUCOSE 133* 136*  BUN 16 12  CREATININE 0.84 0.77  CALCIUM 8.3* 8.1*   PT/INR No results for input(s): LABPROT, INR in the last 72 hours. ABG No results for input(s): PHART, HCO3 in the last 72 hours.  Invalid input(s): PCO2, PO2  Studies/Results:  Anti-infectives: Anti-infectives    Start     Dose/Rate Route Frequency Ordered Stop   02/04/17 1500  cefTRIAXone (ROCEPHIN) 2 g in dextrose 5 %  50 mL IVPB     2 g 100 mL/hr over 30 Minutes Intravenous Every 24 hours 02/04/17 1354     02/04/17 1400  metroNIDAZOLE (FLAGYL) IVPB 500 mg     500 mg 100 mL/hr over 60 Minutes Intravenous Every 8 hours 02/04/17 1354     01/31/17 0800  piperacillin-tazobactam (ZOSYN) IVPB 3.375 g  Status:  Discontinued     3.375 g 12.5 mL/hr over 240 Minutes Intravenous Every 8 hours 01/31/17 0756 02/04/17 1351   01/28/17 2100  piperacillin-tazobactam (ZOSYN) IVPB 3.375 g  Status:  Discontinued     3.375 g 12.5 mL/hr over 240 Minutes Intravenous Every 8 hours 01/28/17 1333 01/31/17 0756   01/28/17 1230  piperacillin-tazobactam (ZOSYN) IVPB 3.375 g     3.375 g 100 mL/hr over 30 Minutes Intravenous  Once 01/28/17 1219 01/29/17 0758      Medications: Scheduled Meds: . feeding supplement (ENSURE ENLIVE)  237 mL Oral BID BM  . heparin subcutaneous  5,000 Units Subcutaneous Q8H  . ondansetron (ZOFRAN) IV  4 mg Intravenous Once  . sodium chloride flush  10 mL Intravenous Q8H  . sodium chloride flush  5 mL Per Tube Q8H  . sodium chloride flush  5 mL Intravenous Q8H   Continuous Infusions: . Marland KitchenTPN (CLINIMIX-E) Adult 83 mL/hr at 02/07/17 0600  . sodium chloride 50 mL/hr at 02/07/17 0600  . cefTRIAXone (ROCEPHIN)  IV Stopped (02/06/17 1644)  . methocarbamol (ROBAXIN)  IV  Stopped (02/06/17 2343)  . metronidazole 500 mg (02/07/17 0626)   PRN Meds:.acetaminophen, diphenhydrAMINE **OR** diphenhydrAMINE, HYDROmorphone (DILAUDID) injection, iopamidol, ondansetron **OR** ondansetron (ZOFRAN) IV, phenol, sodium chloride flush  Rodman Pickle, MD Pg# [phone] [facility]

## 2017-02-07 NOTE — Progress Notes (Signed)
PHARMACY - ADULT TOTAL PARENTERAL NUTRITION CONSULT NOTE   Pharmacy Consult for TPN Indication: enteral intolerance, paralytic ileus  Patient Measurements: Body mass index is 31.5 kg/m. Filed Weights   01/28/17 1102 01/30/17 1306  Weight: 175 lb (79.4 kg) 192 lb 3.9 oz (87.2 kg)    HPI: 21 yoM presented to [facility] ED on 9/10 from [facility] with abdominal pain x4 days, bloating, belching, hiccups, sweats, N/V, unable to tolerate PO intake.  NG tube was placed on 9/10.  Found to have acute ruptured appendicitis and intra-abdominal abscesses. S/p IR drainage on 9/11 with drains remaining in LLQ and R mid abdomen.  He remains at risk for needing exploratory laparotomy.  Pharmacy is consulted to dose dose TPN for enteral intolerance x6 days and paralytic ileus.  Significant events:  9/11: perc drain x2 >> 600cc purulent fluid. 9/13: transgluteal drain placed 9/14: patient reports BM/flatus 9/15: NGT clamped in AM; awaiting diet advancement 9/16: steady return of bowel function, BM, flatus. D/C NGT, starting CLD 9/17: continue CL diet, still with low grade temps, WBC increased.  9/19: cont CL diet, little po intake, abd drain enlarged, drain output 54ml/24hr 9/20: little po intake, drain output 65ml/24 hr. Advance to FL diet, begin TPN wean  Insulin requirements past 24 hours: Novolog correction scale discontinued 9/16  Current Nutrition: CLD, TPN as betow  IVF: NS at 50 ml/hr   Central access: PICC placed 9/12 TPN start date:  9/12  ASSESSMENT                                                                                                          Today:   Glucose: (No hx DM) SSI d/c. Daily serum glucoses avg 130  Electrolytes: Na 134, otherwise wnl, K trending down  Renal: stable WNL  LFTs: all wnl, Albumin low.   TGs: WNL  Prealbumin: 5.0 > 20.6  NGT out, drain output 90 ml/24 hr  Adv to FL diet, begin wean TPN  NUTRITIONAL GOALS                                                                                              Keep K >/= 4.0 and Mg >/= 2.0 with ileus (3.7 and 2.0 today)  RD recs (9/12): 110-127 Protein, 2225-2460 Kcal >> Clinimix E 5/15 at 100 ml/hr  Clinimix 5/20 at a goal rate of 83 ml/hr + 20% fat emulsion 240 ml/day to provide: 100 g of protein and 2233 kCals per day meeting 91% of protein and 100% of kCal needs. ** Limiting TPN rate to 83 ml/hr (one full 2L bag) in order to minimize waste due to ongoing national IVF and  TPN shortages.  Meeting > 90% of estimated needs. **  PLAN                                                                                                                        KCL 10 mEq runs x2 this am  At 1800 today:  Reduce Clinimix E 5/20 to 60 ml/hr  20% fat emulsion at 20 ml/hr x12 hours/day  TPN to contain standard multivitamins and trace element daily.  Continue NS 50 ml/hr   TPN lab panels on Mondays & Thursdays.  F/u daily  Otho Bellows PharmD Pager [phone] 02/07/2017, 7:32 AM

## 2017-02-07 NOTE — Progress Notes (Signed)
Nurse called and was concerned that abdomen was more distended than earlier/yesterday.  He is distended some and sore, but no acute peritonitis.  Taking Ensure - chocolate without any issue, no nausea.  WBC has been up.  Will watch and continue to follow closely.

## 2017-02-07 NOTE — Progress Notes (Signed)
Nutrition Follow-up  INTERVENTION:   Recommend measure weight (last recorded 9/12).  -TPN per Pharmacy -Continue Ensure Enlive po BID, each supplement provides 350 kcal and 20 grams of protein -RD will continue to monitor plan  NUTRITION DIAGNOSIS:   Inadequate oral intake related to inability to eat as evidenced by NPO status.  Now on full liquids.  GOAL:   Patient will meet greater than or equal to 90% of their needs  Progressing.  MONITOR:   Diet advancement, Weight trends, Labs, I & O's, Other (Comment) (TPN regimen)  ASSESSMENT:   21 year old white male with no previous past medical history who presented to [facility] department [facility] with a chief complaint of 4 days of abdominal pain. The patient reports right lower quadrant abdominal pain that started 9/7. Pain described as sharp, constant right lower quadrant pain with intermittent radiation to central lower abdomen. Denies aggravating or alleviating factors. Associated symptoms include abdominal bloating, belching, hiccups, cold sweats, nausea, and vomiting. The patient reports a small loose bowel movement this morning but states bowel function has been irregular. He initially thought he had the flu and attempted to take a cold and flu medication that he got over-the-counter with little relief of symptoms. Due to persistent pain and intolerance of oral intake he presented to the emergency department for evaluation.  Patient now on full liquids. Tolerating so far, some nausea issues yesterday. Ensure supplements will continue if nausea can be controlled.  TPN infusing at goal rate: Clinimix E 5/20 @ 83 ml/hr and 20% ILE @ 20 ml/hr x 12 hours providing 2233 kcal and 100g of protein. TPN to begin weaning now that pt is tolerating full liquids.  Medications: Zofran-ODT PRN Labs reviewed: Low Na  Plan per Pharmacy 9/20: KCL 10 mEq runs x2 this am  At 1800 today: ? Reduce  Clinimix E 5/20 to 60 ml/hr ? 20% fat emulsion at 20 ml/hr x12 hours/day  Diet Order:  .TPN (CLINIMIX-E) Adult Diet full liquid Room service appropriate? Yes; Fluid consistency: Thin .TPN (CLINIMIX-E) Adult  Skin:  Reviewed, no issues  Last BM:  9/19  Height:   Ht Readings from Last 1 Encounters:  02/01/17  (1.676 m)    Weight:   Wt Readings from Last 1 Encounters:  01/30/17 192 lb 3.9 oz (87.2 kg)    Ideal Body Weight:  63.18 kg  BMI:  Body mass index is 31.5 kg/m.  Estimated Nutritional Needs:   Kcal:  2225-2460 (28-31 kcal/kg)  Protein:  110-127 grams (1.4-1.6 grams/kg)  Fluid:  >/= 2.2 L/day  EDUCATION NEEDS:   No education needs identified at this time  Tilda Franco, MS, RD, LDN Pager: [phone] After Hours Pager: [phone]

## 2017-02-08 LAB — CBC
HCT: 29.1 % — ABNORMAL LOW (ref 39.0–52.0)
HEMOGLOBIN: 9.8 g/dL — AB (ref 13.0–17.0)
MCH: 29.3 pg (ref 26.0–34.0)
MCHC: 33.7 g/dL (ref 30.0–36.0)
MCV: 87.1 fL (ref 78.0–100.0)
Platelets: 537 10*3/uL — ABNORMAL HIGH (ref 150–400)
RBC: 3.34 MIL/uL — ABNORMAL LOW (ref 4.22–5.81)
RDW: 13.9 % (ref 11.5–15.5)
WBC: 19.1 10*3/uL — ABNORMAL HIGH (ref 4.0–10.5)

## 2017-02-08 LAB — BASIC METABOLIC PANEL
Anion gap: 8 (ref 5–15)
BUN: 12 mg/dL (ref 6–20)
CHLORIDE: 104 mmol/L (ref 101–111)
CO2: 24 mmol/L (ref 22–32)
Calcium: 8.3 mg/dL — ABNORMAL LOW (ref 8.9–10.3)
Creatinine, Ser: 0.79 mg/dL (ref 0.61–1.24)
GFR calc non Af Amer: >60 mL/min (ref >60)
Glucose, Bld: 115 mg/dL — ABNORMAL HIGH (ref 65–99)
Potassium: 3.9 mmol/L (ref 3.5–5.1)
SODIUM: 136 mmol/L (ref 135–145)

## 2017-02-08 MED ORDER — TRACE MINERALS CR-CU-MN-SE-ZN 10-1000-500-60 MCG/ML IV SOLN
INTRAVENOUS | Status: DC
  Administered 2017-02-08: 18:00:00 via INTRAVENOUS
  Filled 2017-02-08: qty 960

## 2017-02-08 MED ORDER — FAT EMULSION 20 % IV EMUL
240.0000 mL | INTRAVENOUS | Status: DC
  Administered 2017-02-08: 240 mL via INTRAVENOUS
  Filled 2017-02-08: qty 250

## 2017-02-08 NOTE — Progress Notes (Signed)
Progress Note: General Surgery Service   Assessment/Plan: Patient Active Problem List   Diagnosis Date Noted  . Acute appendicitis with rupture 01/28/2017  S/P perc drainage by Dr. Grace Isaac 9/11: 10 Fr drain LLQ, 10 Fr Drain R mid-abdomen >> 600cc purulent fluid. S/P perc drainage by Dr. Bonnielee Haff 9/13: R transgluteal drain. CT 9/18 shows continued RUQ abscess despite drain in reasonable location, RUQ drain upsized 9/18, WBC downtrending 9/21 - follow CX; GS growing E Coli, gram positive cocci, GPR - continue IV abx ceftriaxone/flagyl 9/17 - mobilize, IS  - PRN analgesics and antiemetics Advance diet Begin TPN wean F/u CBC today -f/u IR recs     LOS: 11 days  Chief Complaint/Subjective: No pain, no appetite, tolerated 1.5 ensure yesterday and some juice  Objective: Vital signs in last 24 hours: Temp:  [98.9 F (37.2 C)-99.8 F (37.7 C)] 98.9 F (37.2 C) (09/21 0454) Pulse Rate:  [82-105] 82 (09/21 0454) Resp:  [18-28] 18 (09/21 0454) BP: (119-178)/(58-70) 178/58 (09/21 0454) SpO2:  [99 %-100 %] 99 % (09/21 0454) Last BM Date: 02/07/17  Intake/Output from previous day: 09/20 0701 - 09/21 0700 In: 2938 [P.O.:120; I.V.:2198; IV Piggyback:620] Out: 1775 [Urine:1700; Drains:75] Intake/Output this shift: No intake/output data recorded.  Lungs: CTAB  Cardiovascular: RRR  Abd: soft, NT, ND, drain with purulent drainage  Extremities: no edema  Neuro: AOx4  Lab Results: CBC   Recent Labs  02/07/17 0800 02/08/17 0405  WBC 23.6* 19.1*  HGB 11.3* 9.8*  HCT 33.8* 29.1*  PLT 635* 537*   BMET  Recent Labs  02/07/17 0409 02/08/17 0405  NA 134* 136  K 3.7 3.9  CL 103 104  CO2 25 24  GLUCOSE 136* 115*  BUN 12 12  CREATININE 0.77 0.79  CALCIUM 8.1* 8.3*   PT/INR No results for input(s): LABPROT, INR in the last 72 hours. ABG No results for input(s): PHART, HCO3 in the last 72 hours.  Invalid input(s): PCO2,  PO2  Studies/Results:  Anti-infectives: Anti-infectives    Start     Dose/Rate Route Frequency Ordered Stop   02/04/17 1500  cefTRIAXone (ROCEPHIN) 2 g in dextrose 5 % 50 mL IVPB     2 g 100 mL/hr over 30 Minutes Intravenous Every 24 hours 02/04/17 1354     02/04/17 1400  metroNIDAZOLE (FLAGYL) IVPB 500 mg     500 mg 100 mL/hr over 60 Minutes Intravenous Every 8 hours 02/04/17 1354     01/31/17 0800  piperacillin-tazobactam (ZOSYN) IVPB 3.375 g  Status:  Discontinued     3.375 g 12.5 mL/hr over 240 Minutes Intravenous Every 8 hours 01/31/17 0756 02/04/17 1351   01/28/17 2100  piperacillin-tazobactam (ZOSYN) IVPB 3.375 g  Status:  Discontinued     3.375 g 12.5 mL/hr over 240 Minutes Intravenous Every 8 hours 01/28/17 1333 01/31/17 0756   01/28/17 1230  piperacillin-tazobactam (ZOSYN) IVPB 3.375 g     3.375 g 100 mL/hr over 30 Minutes Intravenous  Once 01/28/17 1219 01/29/17 0758      Medications: Scheduled Meds: . feeding supplement (ENSURE ENLIVE)  237 mL Oral BID BM  . heparin subcutaneous  5,000 Units Subcutaneous Q8H  . ondansetron (ZOFRAN) IV  4 mg Intravenous Once  . sodium chloride flush  10 mL Intravenous Q8H  . sodium chloride flush  5 mL Per Tube Q8H  . sodium chloride flush  5 mL Intravenous Q8H   Continuous Infusions: . Marland KitchenTPN (CLINIMIX-E) Adult 60 mL/hr at 02/07/17 1732  . sodium chloride  50 mL/hr at 02/07/17 2332  . cefTRIAXone (ROCEPHIN)  IV Stopped (02/07/17 1554)  . methocarbamol (ROBAXIN)  IV Stopped (02/08/17 1610)  . metronidazole Stopped (02/08/17 0625)   PRN Meds:.acetaminophen, diphenhydrAMINE **OR** diphenhydrAMINE, HYDROmorphone (DILAUDID) injection, iopamidol, ondansetron **OR** ondansetron (ZOFRAN) IV, phenol, sodium chloride flush  Rodman Pickle, MD Pg# [phone] [facility]

## 2017-02-08 NOTE — Progress Notes (Signed)
PHARMACY - ADULT TOTAL PARENTERAL NUTRITION CONSULT NOTE   Pharmacy Consult for TPN Indication: enteral intolerance, paralytic ileus  Patient Measurements: Body mass index is 31.5 kg/m. Filed Weights   01/28/17 1102 01/30/17 1306  Weight: 175 lb (79.4 kg) 192 lb 3.9 oz (87.2 kg)   HPI: 21 yoM presented to [facility] ED on 9/10 from [facility] with abdominal pain x4 days, bloating, belching, hiccups, sweats, N/V, unable to tolerate PO intake.  NG tube was placed on 9/10.  Found to have acute ruptured appendicitis and intra-abdominal abscesses. S/p IR drainage on 9/11 with drains remaining in LLQ and R mid abdomen.  He remains at risk for needing exploratory laparotomy.  Pharmacy is consulted to dose dose TPN for enteral intolerance x6 days and paralytic ileus.  Significant events:  9/11: perc drain x2 >> 600cc purulent fluid. 9/13: transgluteal drain placed 9/14: patient reports BM/flatus 9/15: NGT clamped in AM; awaiting diet advancement 9/16: steady return of bowel function, BM, flatus. D/C NGT, starting CLD 9/17: continue CL diet, still with low grade temps, WBC increased.  9/19: cont CL diet, little po intake, abd drain enlarged, drain output 4ml/24hr 9/20: little po intake, drain output 64ml/24 hr. Advance to FL diet, begin TPN wean 9/21: adv diet to Regular, continue to wean TPN  Insulin requirements past 24 hours: Novolog correction scale discontinued 9/16  Current Nutrition: CLD, TPN as betow  IVF: NS at 50 ml/hr   Central access: PICC placed 9/12 TPN start date:  9/12  ASSESSMENT                                                                                                          Today:   Glucose: (No hx DM) SSI d/c. Serum glucose 115 this am  Electrolytes: all wnl, Corr Ca 9.34  Renal: stable WNL  LFTs: all wnl, Albumin low- 2.7  TGs: WNL  Prealbumin: 5.0 > 20.6  NGT out, drain output 55 ml/24 hr  Adv to Reg diet, continue to wean  TPN  NUTRITIONAL GOALS                                                                                             Keep K >/= 4.0 and Mg >/= 2.0 with ileus (3.7 and 2.0 today)  RD recs (9/12): 110-127 Protein, 2225-2460 Kcal >> Clinimix E 5/15 at 100 ml/hr  Clinimix 5/20 at a goal rate of 83 ml/hr + 20% fat emulsion 240 ml/day to provide: 100 g of protein and 2233 kCals per day meeting 91% of protein and 100% of kCal needs. ** Limiting TPN rate to 83 ml/hr (one full 2L bag) in order to minimize  waste due to ongoing national IVF and TPN shortages.  Meeting > 90% of estimated needs. **  Clinimix E 5/20 at 40 ml/hr & Lipids 20% delivers 48 gm Protein/ 1324 kCal Ensure delivers 20gm Protein/ 350 kCal per , ordered twice daily  PLAN                                                                                                                        KCL 10 mEq runs x2 this am  At 1800 today:  Reduce Clinimix E 5/20 to 40 ml/hr  20% fat emulsion at 20 ml/hr x12 hours/day  TPN to contain standard multivitamins and trace element daily.  Continue NS 50 ml/hr   TPN lab panels on Mondays & Thursdays.  F/u daily  Otho Bellows PharmD Pager [phone] 02/08/2017, 9:54 AM

## 2017-02-08 NOTE — Progress Notes (Signed)
Referring Physician(s):  Dr. Luretha Murphy  Supervising Physician: Gilmer Mor  Patient Status:  [facility] - In-pt  Chief Complaint:  Appendiceal abscess  Subjective: Asking about going home.  Now with one 12Fr drain.   Allergies: Patient has no known allergies.  Medications: Prior to Admission medications   Medication Sig Start Date End Date Taking? Authorizing Provider  ibuprofen (ADVIL,MOTRIN) 200 MG tablet Take 400 mg by mouth every 6 (six) hours as needed for moderate pain or cramping.   Yes [provider]     Vital Signs: BP (!) 178/58 (BP Location: Left Arm)   Pulse 82   Temp 98.9 F (37.2 C) (Oral)   Resp 18   Ht  (1.676 m)   Wt 192 lb 3.9 oz (87.2 kg)   SpO2 99%   BMI 31.50 kg/m   Physical Exam  NAD, alert Abd: soft, nontender, purulent-dark fluid in bulb, Insertion site intact without erythema or warmth  Imaging: Ct Abdomen Pelvis W Contrast  Result Date: 02/05/2017 CLINICAL DATA:  Inpatient. Acute ruptured appendicitis complicated by multiple intra-abdominal abscesses requiring multiple percutaneous drains, presenting for follow-up. Generalized abdominal pain and tenderness. EXAM: CT ABDOMEN AND PELVIS WITH CONTRAST TECHNIQUE: Multidetector CT imaging of the abdomen and pelvis was performed using the standard protocol following bolus administration of intravenous contrast. CONTRAST:  ISOVUE-300 IOPAMIDOL (ISOVUE-300) INJECTION 61% COMPARISON:  01/31/2017 CT abdomen/ pelvis. FINDINGS: Lower chest: Small dependent basilar right pleural effusion, stable. Stable mild compressive atelectasis in the dependent right lower lobe. Resolved left pleural effusion. Hepatobiliary: Normal liver with no liver mass. Normal gallbladder with Phrygian cap, with no radiopaque cholelithiasis. No biliary ductal dilatation. Pancreas: Normal, with no mass or duct dilation. Spleen: Normal size. No mass. Adrenals/Urinary Tract: Normal adrenals. Normal kidneys with no  hydronephrosis and no renal mass. Normal bladder. Stomach/Bowel: Grossly normal stomach. Mildly dilated small bowel loops throughout the abdomen, overall decreased in caliber in the interval. No discrete small bowel caliber transition. Stable reactive wall thickening in the distal ileum adjacent to the persistently inflamed perforated appendix, which is dilated up to 13 mm diameter with thick enhancing wall and periappendiceal fat stranding. Mild reactive wall thickening in the distal sigmoid colon, decreased. No new sites of large bowel wall thickening. Scattered fluid levels throughout the proximal and transverse colon. Vascular/Lymphatic: Normal caliber abdominal aorta. Patent portal, splenic, hepatic and renal veins. Stable mildly enlarged right mesenteric lymph nodes up to 1.2 cm (series 2/ image 47). No new pathologically enlarged abdominopelvic nodes. Reproductive: Normal size prostate. Other: Right lateral abdominal wall percutaneous drainage catheter terminates within the posterior aspect of an 8.0 x 4.3 x 15.4 cm gas containing right infrahepatic abscess with thick enhancing wall (series 2/ image 45), previously 8.6 x 6.5 x 19.3 cm using similar measurement technique, mildly decreased. Central pelvic 4.2 x 2.6 x 2.1 cm abscess with thick enhancing wall, located superior to the bladder (series 2/image 70), previously 5.1 x 2.7 x 2.0 cm, minimally decreased. Ventral left lower abdominal wall percutaneous drain is stable in position with no recurrent fluid collection associated with this catheter. No residual fluid collection in the deep pelvis between the bladder and rectum status post transgluteal percutaneous drainage catheter placement. Musculoskeletal: No aggressive appearing focal osseous lesions. IMPRESSION: 1. Resolved deep pelvic abscess between the bladder and rectum status post transgluteal percutaneous drainage catheter placement. 2. No recurrence of the previously drained left lower quadrant  abscess with stable position of the associated percutaneous drain. 3. Slightly  decreased size of thick-walled abscess in the central pelvis superior to the bladder. 4. Irregular gas containing right infrahepatic abscess is mildly decreased in size, with associated percutaneous drain terminating in the posterior aspect of the abscess. 5. Stable small dependent right basilar pleural effusion. Resolved left pleural effusion. 6. Persistently inflamed perforated appendix in the right lower quadrant. 7. Improving adynamic ileus of the small and large bowel. 8. Stable mild right mesenteric adenopathy, probably reactive. Electronically Signed   By: Delbert Phenix M.D.   On: 02/05/2017 11:10   Ct Image Guided Fluid Drain By Catheter  Result Date: 02/06/2017 INDICATION: Ruptured appendicitis and status post placement of 3 separate prior percutaneous drainage catheters to treat appendiceal and peritoneal abscesses. Two of the drains were removed earlier today. There is a residual appendiceal abscess in the right lower quadrant that is not fully drained with little output from the indwelling drainage catheter positioned within the posterior aspect of the collection. He now presents for repositioning/ upsizing of the drain versus placement of a new drain. EXAM: CT-GUIDED DRAINAGE OF APPENDICEAL ABSCESS BY NEW CATHETER PLACEMENT MEDICATIONS: The patient is currently admitted to the hospital and receiving intravenous antibiotics. The antibiotics were administered within an appropriate time frame prior to the initiation of the procedure. ANESTHESIA/SEDATION: Fentanyl 100 mcg IV; Versed 2.0 mg IV Moderate Sedation Time:  25 minutes. The patient was continuously monitored during the procedure by the interventional radiology nurse under my direct supervision. COMPLICATIONS: None immediate. PROCEDURE: Informed written consent was obtained from the patient after a thorough discussion of the procedural risks, benefits and alternatives.  All questions were addressed. Maximal Sterile Barrier Technique was utilized including caps, mask, sterile gowns, sterile gloves, sterile drape, hand hygiene and skin antiseptic. A timeout was performed prior to the initiation of the procedure. The right side was rolled up slightly and additional imaging performed of the pre-existing right lower quadrant drainage catheter and adjacent abscess. The catheter was prepped with Betadine. A guidewire was advanced through the drainage catheter in the drainage catheter removed. A 5 French catheter was then advanced over the wire and attempts made to re-direct the wire. A site was chosen for new access of a residual right lower quadrant peritoneal abscess anterior and superior to the pre-existing drainage catheter site. An 18 gauge trocar needle was advanced under CT guidance at this level into the residual abscess. A guidewire was advanced through the needle. The tract was dilated over the wire and a 12 French percutaneous drain placed. The drain was further retracted under CT guidance into the abscess cavity. The drainage catheter was connected to a suction bulb. It was secured at the skin with a Prolene retention suture and StatLock device. The 5 French catheter and guidewire positioned at the level of the previously placed drain were removed. FINDINGS: After removal of the previously placed drainage catheter over a guidewire, it became evident that the guidewire was persistently conforming to the course of the drainage catheter and could not be repositioned even with manipulation with a 5 French catheter. New puncture of the residual abscess therefore had to be performed in order to provide adequate percutaneous drainage. The previous percutaneous access was abandoned. After puncture of the residual abscess at a new location, there was immediate return of purulent fluid. A 12 French drain was placed and there is good return of fluid from the drain after placement.  IMPRESSION: Placement of new 12 French percutaneous drain in residual undrained right lower quadrant appendiceal abscess. The previously  placed drain was removed over a guidewire and guidewire position could not be manipulated into the residual undrained abscess. Electronically Signed   By: Irish Lack M.D.   On: 02/06/2017 07:42    Labs:  CBC:  Recent Labs  02/04/17 0339 02/06/17 0453 02/07/17 0800 02/08/17 0405  WBC 20.6* 25.7* 23.6* 19.1*  HGB 11.4* 11.2* 11.3* 9.8*  HCT 33.9* 34.1* 33.8* 29.1*  PLT 614* 638* 635* 537*    COAGS:  Recent Labs  01/29/17 0452  INR 1.15    BMP:  Recent Labs  02/05/17 0421 02/06/17 0453 02/07/17 0409 02/08/17 0405  NA 135 134* 134* 136  K 4.0 4.2 3.7 3.9  CL 103 103 103 104  CO2 GLUCOSE 124* 133* 136* 115*  BUN CALCIUM 8.5* 8.3* 8.1* 8.3*  CREATININE 0.75 0.84 0.77 0.79  GFRNONAA >60 >60 >60 >60  GFRAA >60 >60 >60 >60    LIVER FUNCTION TESTS:  Recent Labs  01/28/17 1120 01/31/17 0459 02/04/17 0339 02/07/17 0409  BILITOT 1.8* 0.8 0.6 0.5  AST 31 41 49* 18  ALT 34 45 93* 38  ALKPHOS 79 69 103 80  PROT 8.0 5.8* 6.8 6.4*  ALBUMIN 3.6 2.3* 2.7* 2.7*    Assessment and Plan: Ruptured appendix, appendiceal abscess s/p drain placements: -LLQ drain placed 9/11 >> removed -TG drain placed 9/13 >> removed -RLQ drain placed 9/11, upsized 9/18 to 12Fr drain Patient continues on TPN.  States he has been able to tolerate some liquids.  Asks about discharge plans.  Continue drain care.  IR to follow.  Will placed outpatient drain clinic order for schedulers to be able to contact patient with date and time of appointment.   Electronically Signed: Hoyt Koch, PA 02/08/2017, 11:12 AM   I spent a total of 15 Minutes at the the patient's bedside AND on the patient's hospital floor or unit, greater than 50% of which was counseling/coordinating care for appendiceal abscess.

## 2017-02-09 LAB — CBC
HEMATOCRIT: 30.7 % — AB (ref 39.0–52.0)
HEMOGLOBIN: 10.4 g/dL — AB (ref 13.0–17.0)
MCH: 29.5 pg (ref 26.0–34.0)
MCHC: 33.9 g/dL (ref 30.0–36.0)
MCV: 87.2 fL (ref 78.0–100.0)
Platelets: 594 10*3/uL — ABNORMAL HIGH (ref 150–400)
RBC: 3.52 MIL/uL — AB (ref 4.22–5.81)
RDW: 14 % (ref 11.5–15.5)
WBC: 17.2 10*3/uL — AB (ref 4.0–10.5)

## 2017-02-09 MED ORDER — FAT EMULSION 20 % IV EMUL
240.0000 mL | INTRAVENOUS | Status: DC
  Filled 2017-02-09: qty 250

## 2017-02-09 MED ORDER — TRACE MINERALS CR-CU-MN-SE-ZN 10-1000-500-60 MCG/ML IV SOLN
INTRAVENOUS | Status: DC
  Filled 2017-02-09: qty 960

## 2017-02-09 NOTE — Progress Notes (Addendum)
PHARMACY - ADULT TOTAL PARENTERAL NUTRITION CONSULT NOTE   Pharmacy Consult for TPN Indication: enteral intolerance, paralytic ileus  Patient Measurements: Body mass index is 31.5 kg/m. Filed Weights   01/28/17 1102 01/30/17 1306  Weight: 175 lb (79.4 kg) 192 lb 3.9 oz (87.2 kg)   HPI: 21 yoM presented to [facility] ED on 9/10 from [facility] with abdominal pain x4 days, bloating, belching, hiccups, sweats, N/V, unable to tolerate PO intake.  NG tube was placed on 9/10.  Found to have acute ruptured appendicitis and intra-abdominal abscesses. S/p IR drainage on 9/11 with drains remaining in LLQ and R mid abdomen.  He remains at risk for needing exploratory laparotomy.  Pharmacy is consulted to dose TPN for enteral intolerance x 6 days and paralytic ileus.  Significant events:  9/11: perc drain x2 >> 600cc purulent fluid. 9/13: transgluteal drain placed 9/14: patient reports BM/flatus 9/15: NGT clamped in AM; awaiting diet advancement 9/16: steady return of bowel function, BM, flatus. D/C NGT, starting CLD 9/17: continue CL diet, still with low grade temps, WBC increased.  9/19: cont CL diet, little po intake, abd drain enlarged, drain output 91ml/24hr 9/20: little po intake, drain output 75ml/24 hr. Advance to FL diet, begin TPN wean 9/21: adv diet to Regular, continue to wean TPN  Insulin requirements past 24 hours: Novolog correction scale discontinued 9/16  Current Nutrition: Regular diet, TPN as betow  IVF: NS at 50 ml/hr   Central access: PICC placed 9/12 TPN start date:  9/12  ASSESSMENT                                                                                                          Today:   Glucose: (No hx DM) SSI d/c. Serum glucose 115 on 9/21  Electrolytes: 9/21 - all wnl, Corr Ca 9.34  Renal: stable WNL  LFTs: all wnl, Albumin low- 2.7  TGs: WNL  Prealbumin: 5.0 > 20.6  NGT out, drain output 65 ml/24 hr  Reg diet, fair intake. Continue to  wean TPN  NUTRITIONAL GOALS                                                                                             Keep K >/= 4.0 and Mg >/= 2.0 with ileus (3.7 and 2.0 today)  RD recs (9/12): 110-127 Protein, 2225-2460 Kcal >> Clinimix E 5/15 at 100 ml/hr  Clinimix 5/20 at a goal rate of 83 ml/hr + 20% fat emulsion 240 ml/day to provide: 100 g of protein and 2233 kCals per day meeting 91% of protein and 100% of kCal needs. ** Limiting TPN rate to 83 ml/hr (one full 2L bag) in  order to minimize waste due to ongoing national IVF and TPN shortages.  Meeting > 90% of estimated needs. **  Clinimix E 5/20 at 40 ml/hr & Lipids 20% delivers 48 gm Protein/ 1324 kCal Ensure delivers 20gm Protein/ 350 kCal per , ordered twice daily  PLAN                                                                                                                         At 1800 today:  Cont Clinimix E 5/20 at 40 ml/hr  Cont 20% fat emulsion at 20 ml/hr x12 hours/day  TPN to contain standard multivitamins and trace element daily.  Continue NS 50 ml/hr   Bmet and Mag in AM  TPN lab panels on Mondays & Thursdays.  F/u daily  Charolotte Eke, PharmD, pager [phone]. 02/09/2017,8:05 AM.

## 2017-02-09 NOTE — Progress Notes (Signed)
Assessment Active Problems:   Acute appendicitis with rupture and abscess-one remaining drain in RUQ; WBC slowly coming down   FEN-on regular diet and TPN   Plan:  Stop TPN after this bag.  Continue abxs. Check WBC tomorrow.   LOS: 12 days        Chief Complaint/Subjective: Does not complain of paining.  Eating some solid food.  Objective: Vital signs in last 24 hours: Temp:  [98.6 F (37 C)-99.7 F (37.6 C)] 99.7 F (37.6 C) (09/22 0446) Pulse Rate:  [86-103] 86 (09/22 0446) Resp:  [18] 18 (09/22 0446) BP: (123-128)/(68-83) 123/83 (09/22 0446) SpO2:  [99 %-100 %] 99 % (09/22 0446) Last BM Date: 02/08/17  Intake/Output from previous day: 09/21 0701 - 09/22 0700 In: 1561.7 [I.V.:1496.7; IV Piggyback:55] Out: 1995 [Urine:1950; Drains:45] Intake/Output this shift: Total I/O In: 120 [P.O.:120] Out: -   PE: General- In NAD.  Awake and alert. Abdomen-soft, not tender, greenish-yellow drain output  Lab Results:   Recent Labs  02/08/17 0405 02/09/17 0417  WBC 19.1* 17.2*  HGB 9.8* 10.4*  HCT 29.1* 30.7*  PLT 537* 594*   BMET  Recent Labs  02/07/17 0409 02/08/17 0405  NA 134* 136  K 3.7 3.9  CL 103 104  CO2 25 24  GLUCOSE 136* 115*  BUN 12 12  CREATININE 0.77 0.79  CALCIUM 8.1* 8.3*   PT/INR No results for input(s): LABPROT, INR in the last 72 hours. Comprehensive Metabolic Panel:    Component Value Date/Time   NA 136 02/08/2017 0405   NA 134 (L) 02/07/2017 0409   K 3.9 02/08/2017 0405   K 3.7 02/07/2017 0409   CL 104 02/08/2017 0405   CL 103 02/07/2017 0409   CO2 24 02/08/2017 0405   CO2 25 02/07/2017 0409   BUN 12 02/08/2017 0405   BUN 12 02/07/2017 0409   CREATININE 0.79 02/08/2017 0405   CREATININE 0.77 02/07/2017 0409   GLUCOSE 115 (H) 02/08/2017 0405   GLUCOSE 136 (H) 02/07/2017 0409   CALCIUM 8.3 (L) 02/08/2017 0405   CALCIUM 8.1 (L) 02/07/2017 0409   AST 18 02/07/2017 0409   AST 49 (H) 02/04/2017 0339   ALT 38 02/07/2017 0409   ALT 93 (H) 02/04/2017 0339   ALKPHOS 80 02/07/2017 0409   ALKPHOS 103 02/04/2017 0339   BILITOT 0.5 02/07/2017 0409   BILITOT 0.6 02/04/2017 0339   PROT 6.4 (L) 02/07/2017 0409   PROT 6.8 02/04/2017 0339   ALBUMIN 2.7 (L) 02/07/2017 0409   ALBUMIN 2.7 (L) 02/04/2017 0339     Studies/Results: No results found.  Anti-infectives: Anti-infectives    Start     Dose/Rate Route Frequency Ordered Stop   02/04/17 1500  cefTRIAXone (ROCEPHIN) 2 g in dextrose 5 % 50 mL IVPB     2 g 100 mL/hr over 30 Minutes Intravenous Every 24 hours 02/04/17 1354     02/04/17 1400  metroNIDAZOLE (FLAGYL) IVPB 500 mg     500 mg 100 mL/hr over 60 Minutes Intravenous Every 8 hours 02/04/17 1354     01/31/17 0800  piperacillin-tazobactam (ZOSYN) IVPB 3.375 g  Status:  Discontinued     3.375 g 12.5 mL/hr over 240 Minutes Intravenous Every 8 hours 01/31/17 0756 02/04/17 1351   01/28/17 2100  piperacillin-tazobactam (ZOSYN) IVPB 3.375 g  Status:  Discontinued     3.375 g 12.5 mL/hr over 240 Minutes Intravenous Every 8 hours 01/28/17 1333 01/31/17 0756   01/28/17 1230  piperacillin-tazobactam (ZOSYN) IVPB 3.375 g  3.375 g 100 mL/hr over 30 Minutes Intravenous  Once 01/28/17 1219 01/29/17 0758       Czar Ysaguirre J 02/09/2017

## 2017-02-10 LAB — MAGNESIUM: MAGNESIUM: 1.8 mg/dL (ref 1.7–2.4)

## 2017-02-10 LAB — CBC
HCT: 31.1 % — ABNORMAL LOW (ref 39.0–52.0)
HEMOGLOBIN: 10.2 g/dL — AB (ref 13.0–17.0)
MCH: 28.5 pg (ref 26.0–34.0)
MCHC: 32.8 g/dL (ref 30.0–36.0)
MCV: 86.9 fL (ref 78.0–100.0)
Platelets: 538 10*3/uL — ABNORMAL HIGH (ref 150–400)
RBC: 3.58 MIL/uL — ABNORMAL LOW (ref 4.22–5.81)
RDW: 13.9 % (ref 11.5–15.5)
WBC: 12.8 10*3/uL — ABNORMAL HIGH (ref 4.0–10.5)

## 2017-02-10 LAB — BASIC METABOLIC PANEL
Anion gap: 7 (ref 5–15)
BUN: 15 mg/dL (ref 6–20)
CO2: 25 mmol/L (ref 22–32)
Calcium: 8.7 mg/dL — ABNORMAL LOW (ref 8.9–10.3)
Chloride: 105 mmol/L (ref 101–111)
Creatinine, Ser: 0.81 mg/dL (ref 0.61–1.24)
Glucose, Bld: 84 mg/dL (ref 65–99)
POTASSIUM: 4.3 mmol/L (ref 3.5–5.1)
SODIUM: 137 mmol/L (ref 135–145)

## 2017-02-10 NOTE — Progress Notes (Signed)
    Referring Physician(s): CCS/M Daphine Deutscher  Supervising Physician: Irish Lack  Patient Status:  [facility] - In-pt  Chief Complaint:  Ruptured appx  Subjective:  abscess drain placed 9/11 Doing well Eating reg diet ambulating  Allergies: Patient has no known allergies.  Medications: Prior to Admission medications   Medication Sig Start Date End Date Taking? Authorizing Provider  ibuprofen (ADVIL,MOTRIN) 200 MG tablet Take 400 mg by mouth every 6 (six) hours as needed for moderate pain or cramping.   Yes [provider]     Vital Signs: BP 125/72 (BP Location: Left Arm)   Pulse 87   Temp 98.4 F (36.9 C) (Oral)   Resp 16   Ht  (1.676 m)   Wt 192 lb 3.9 oz (87.2 kg)   SpO2 99%   BMI 31.50 kg/m   Physical Exam  Abdominal: Soft. Bowel sounds are normal. There is no tenderness.  Neurological: He is alert.  Skin: Skin is warm and dry.  Site is clean and dry NT no bleeding OP milky brown 40 cc yesterday 20 cc in JP +ecoli Wbc trending down afeb   Nursing note and vitals reviewed.   Imaging: No results found.  Labs:  CBC:  Recent Labs  02/06/17 0453 02/07/17 0800 02/08/17 0405 02/09/17 0417  WBC 25.7* 23.6* 19.1* 17.2*  HGB 11.2* 11.3* 9.8* 10.4*  HCT 34.1* 33.8* 29.1* 30.7*  PLT 638* 635* 537* 594*    COAGS:  Recent Labs  01/29/17 0452  INR 1.15    BMP:  Recent Labs  02/06/17 0453 02/07/17 0409 02/08/17 0405 02/10/17 0422  NA 134* 134* 136 137  K 4.2 3.7 3.9 4.3  CL 103 103 104 105  CO2 GLUCOSE 133* 136* 115* 84  BUN CALCIUM 8.3* 8.1* 8.3* 8.7*  CREATININE 0.84 0.77 0.79 0.81  GFRNONAA >60 >60 >60 >60  GFRAA >60 >60 >60 >60    LIVER FUNCTION TESTS:  Recent Labs  01/28/17 1120 01/31/17 0459 02/04/17 0339 02/07/17 0409  BILITOT 1.8* 0.8 0.6 0.5  AST 31 41 49* 18  ALT 34 45 93* 38  ALKPHOS 79 69 103 80  PROT 8.0 5.8* 6.8 6.4*  ALBUMIN 3.6 2.3* 2.7* 2.7*    Assessment and  Plan:  Wbc trending down afeb OP significant still If home with drain--- need flush with 5 cc sterile saline every day IR will follow in OP Drain Clinic Orders in for same---he will hear from scheduler for time and date  Electronically Signed: Adorian Gwynne A, PA-C 02/10/2017, 9:53 AM   I spent a total of 15 Minutes at the the patient's bedside AND on the patient's hospital floor or unit, greater than 50% of which was counseling/coordinating care for abscess drain

## 2017-02-10 NOTE — Progress Notes (Signed)
Assessment Active Problems:   Acute appendicitis with rupture and abscess-one remaining drain in RUQ; WBC pending; purulent drain output   FEN-on regular diet   Plan:  Check WBC.  Continue IV abxs.     LOS: 13 days        Chief Complaint/Subjective: Feels okay.  Eating ok.  Objective: Vital signs in last 24 hours: Temp:  [98.4 F (36.9 C)-99.4 F (37.4 C)] 98.4 F (36.9 C) (09/23 0515) Pulse Rate:  [87-94] 87 (09/23 0515) Resp:  [16] 16 (09/23 0515) BP: (125-132)/(60-79) 125/72 (09/23 0515) SpO2:  [99 %-100 %] 99 % (09/23 0515) Last BM Date: 02/09/17  Intake/Output from previous day: 09/22 0701 - 09/23 0700 In: 1405 [P.O.:240; I.V.:805; IV Piggyback:360] Out: 637 [Urine:600; Drains:37] Intake/Output this shift: Total I/O In: 5 [Other:5] Out: -   PE: General- In NAD.  Awake and alert. Abdomen-soft, not tender, purulent appearing drain output today, bulb half full  Lab Results:   Recent Labs  02/08/17 0405 02/09/17 0417  WBC 19.1* 17.2*  HGB 9.8* 10.4*  HCT 29.1* 30.7*  PLT 537* 594*   BMET  Recent Labs  02/08/17 0405 02/10/17 0422  NA 136 137  K 3.9 4.3  CL 104 105  CO2 24 25  GLUCOSE 115* 84  BUN 12 15  CREATININE 0.79 0.81  CALCIUM 8.3* 8.7*   PT/INR No results for input(s): LABPROT, INR in the last 72 hours. Comprehensive Metabolic Panel:    Component Value Date/Time   NA 137 02/10/2017 0422   NA 136 02/08/2017 0405   K 4.3 02/10/2017 0422   K 3.9 02/08/2017 0405   CL 105 02/10/2017 0422   CL 104 02/08/2017 0405   CO2 25 02/10/2017 0422   CO2 24 02/08/2017 0405   BUN 15 02/10/2017 0422   BUN 12 02/08/2017 0405   CREATININE 0.81 02/10/2017 0422   CREATININE 0.79 02/08/2017 0405   GLUCOSE 84 02/10/2017 0422   GLUCOSE 115 (H) 02/08/2017 0405   CALCIUM 8.7 (L) 02/10/2017 0422   CALCIUM 8.3 (L) 02/08/2017 0405   AST 18 02/07/2017 0409   AST 49 (H) 02/04/2017 0339   ALT 38 02/07/2017 0409   ALT 93 (H) 02/04/2017 0339   ALKPHOS 80  02/07/2017 0409   ALKPHOS 103 02/04/2017 0339   BILITOT 0.5 02/07/2017 0409   BILITOT 0.6 02/04/2017 0339   PROT 6.4 (L) 02/07/2017 0409   PROT 6.8 02/04/2017 0339   ALBUMIN 2.7 (L) 02/07/2017 0409   ALBUMIN 2.7 (L) 02/04/2017 0339     Studies/Results: No results found.  Anti-infectives: Anti-infectives    Start     Dose/Rate Route Frequency Ordered Stop   02/04/17 1500  cefTRIAXone (ROCEPHIN) 2 g in dextrose 5 % 50 mL IVPB     2 g 100 mL/hr over 30 Minutes Intravenous Every 24 hours 02/04/17 1354     02/04/17 1400  metroNIDAZOLE (FLAGYL) IVPB 500 mg     500 mg 100 mL/hr over 60 Minutes Intravenous Every 8 hours 02/04/17 1354     01/31/17 0800  piperacillin-tazobactam (ZOSYN) IVPB 3.375 g  Status:  Discontinued     3.375 g 12.5 mL/hr over 240 Minutes Intravenous Every 8 hours 01/31/17 0756 02/04/17 1351   01/28/17 2100  piperacillin-tazobactam (ZOSYN) IVPB 3.375 g  Status:  Discontinued     3.375 g 12.5 mL/hr over 240 Minutes Intravenous Every 8 hours 01/28/17 1333 01/31/17 0756   01/28/17 1230  piperacillin-tazobactam (ZOSYN) IVPB 3.375 g  3.375 g 100 mL/hr over 30 Minutes Intravenous  Once 01/28/17 1219 01/29/17 0758       Adam Mcdowell J 02/10/2017

## 2017-02-11 LAB — CBC
HCT: 30.7 % — ABNORMAL LOW (ref 39.0–52.0)
Hemoglobin: 10.2 g/dL — ABNORMAL LOW (ref 13.0–17.0)
MCH: 28.3 pg (ref 26.0–34.0)
MCHC: 33.2 g/dL (ref 30.0–36.0)
MCV: 85.3 fL (ref 78.0–100.0)
Platelets: 516 10*3/uL — ABNORMAL HIGH (ref 150–400)
RBC: 3.6 MIL/uL — AB (ref 4.22–5.81)
RDW: 13.5 % (ref 11.5–15.5)
WBC: 11.2 10*3/uL — AB (ref 4.0–10.5)

## 2017-02-11 LAB — DIFFERENTIAL
BASOS ABS: 0.1 10*3/uL (ref 0.0–0.1)
Basophils Relative: 1 %
EOS ABS: 0.3 10*3/uL (ref 0.0–0.7)
Eosinophils Relative: 2 %
LYMPHS ABS: 2.4 10*3/uL (ref 0.7–4.0)
LYMPHS PCT: 22 %
MONOS PCT: 6 %
Monocytes Absolute: 0.7 10*3/uL (ref 0.1–1.0)
NEUTROS PCT: 69 %
Neutro Abs: 7.7 10*3/uL (ref 1.7–7.7)

## 2017-02-11 LAB — COMPREHENSIVE METABOLIC PANEL
ALT: 31 U/L (ref 17–63)
AST: 17 U/L (ref 15–41)
Albumin: 2.8 g/dL — ABNORMAL LOW (ref 3.5–5.0)
Alkaline Phosphatase: 80 U/L (ref 38–126)
Anion gap: 8 (ref 5–15)
BUN: 14 mg/dL (ref 6–20)
CHLORIDE: 105 mmol/L (ref 101–111)
CO2: 24 mmol/L (ref 22–32)
CREATININE: 0.88 mg/dL (ref 0.61–1.24)
Calcium: 8.7 mg/dL — ABNORMAL LOW (ref 8.9–10.3)
GFR calc Af Amer: >60 mL/min (ref >60)
GFR calc non Af Amer: >60 mL/min (ref >60)
Glucose, Bld: 94 mg/dL (ref 65–99)
POTASSIUM: 4.3 mmol/L (ref 3.5–5.1)
SODIUM: 137 mmol/L (ref 135–145)
Total Bilirubin: 0.3 mg/dL (ref 0.3–1.2)
Total Protein: 6.7 g/dL (ref 6.5–8.1)

## 2017-02-11 LAB — MAGNESIUM: Magnesium: 1.8 mg/dL (ref 1.7–2.4)

## 2017-02-11 LAB — TRIGLYCERIDES: Triglycerides: 112 mg/dL (ref <150)

## 2017-02-11 LAB — PREALBUMIN: Prealbumin: 22.6 mg/dL (ref 18–38)

## 2017-02-11 LAB — PHOSPHORUS: Phosphorus: 5.3 mg/dL — ABNORMAL HIGH (ref 2.5–4.6)

## 2017-02-11 MED ORDER — HYDROMORPHONE HCL-NACL 0.5-0.9 MG/ML-% IV SOSY: 0.5000 mg | PREFILLED_SYRINGE | Freq: Three times a day (TID) | INTRAVENOUS | Status: DC | PRN

## 2017-02-11 MED ORDER — AMOXICILLIN-POT CLAVULANATE 875-125 MG PO TABS
1.0000 | ORAL_TABLET | Freq: Two times a day (BID) | ORAL | Status: DC
  Administered 2017-02-11 – 2017-02-12 (×3): 1 via ORAL
  Filled 2017-02-11 (×4): qty 1

## 2017-02-11 MED ORDER — SACCHAROMYCES BOULARDII 250 MG PO CAPS
250.0000 mg | ORAL_CAPSULE | Freq: Two times a day (BID) | ORAL | Status: DC
  Administered 2017-02-11 – 2017-02-12 (×3): 250 mg via ORAL
  Filled 2017-02-11 (×3): qty 1

## 2017-02-11 MED ORDER — METHOCARBAMOL 500 MG PO TABS
500.0000 mg | ORAL_TABLET | Freq: Three times a day (TID) | ORAL | Status: DC
  Administered 2017-02-11 – 2017-02-12 (×3): 500 mg via ORAL
  Filled 2017-02-11 (×3): qty 1

## 2017-02-11 MED ORDER — OXYCODONE HCL 5 MG PO TABS
5.0000 mg | ORAL_TABLET | ORAL | Status: DC | PRN
  Administered 2017-02-11 (×3): 5 mg via ORAL
  Filled 2017-02-11 (×3): qty 1

## 2017-02-11 NOTE — Progress Notes (Signed)
Patient ID: Adam Mcdowell, male   DOB: 04/27/1996, 21 y.o.   MRN: [id number]  [facility] Surgery Progress Note     Subjective: CC- perforated appendicitis Patient states that his abdominal pain is improving. Used dilaudid 4x yesterday. Denies n/v but continues to have suppressed appetite. Had a BM yesterday. WBC down to 11.2, TMAX 99.1.  Objective: Vital signs in last 24 hours: Temp:  [98.6 F (37 C)-99.1 F (37.3 C)] 98.6 F (37 C) (09/24 0536) Pulse Rate:  [67-89] 67 (09/24 0536) Resp:  [16-18] 18 (09/24 0536) BP: (110-130)/(56-67) 110/56 (09/24 0536) SpO2:  [98 %-100 %] 98 % (09/24 0536) Last BM Date: 02/09/17  Intake/Output from previous day: 09/23 0701 - 09/24 0700 In: 1850.8 [P.O.:120; I.V.:1190.8; IV Piggyback:515] Out: 2250 [Urine:2250] Intake/Output this shift: No intake/output data recorded.  PE: Gen:  Alert, NAD HEENT: EOM's intact, pupils equal and round Card:  RRR, no M/G/R heard Pulm:  CTAB, no W/R/R, effort normal Abd: Soft, ND, NT, +BS, no HSM, no hernia, drain with purulent fluid in bulb Ext:  No erythema, edema, or tenderness BUE/BLE  Psych: A&Ox3  Skin: no rashes noted, warm and dry  Lab Results:   Recent Labs  02/10/17 1030 02/11/17 0414  WBC 12.8* 11.2*  HGB 10.2* 10.2*  HCT 31.1* 30.7*  PLT 538* 516*   BMET  Recent Labs  02/10/17 0422 02/11/17 0414  NA 137 137  K 4.3 4.3  CL 105 105  CO2 25 24  GLUCOSE 84 94  BUN 15 14  CREATININE 0.81 0.88  CALCIUM 8.7* 8.7*   PT/INR No results for input(s): LABPROT, INR in the last 72 hours. CMP     Component Value Date/Time   NA 137 02/11/2017 0414   K 4.3 02/11/2017 0414   CL 105 02/11/2017 0414   CO2 24 02/11/2017 0414   GLUCOSE 94 02/11/2017 0414   BUN 14 02/11/2017 0414   CREATININE 0.88 02/11/2017 0414   CALCIUM 8.7 (L) 02/11/2017 0414   PROT 6.7 02/11/2017 0414   ALBUMIN 2.8 (L) 02/11/2017 0414   AST 17 02/11/2017 0414   ALT 31 02/11/2017 0414   ALKPHOS 80  02/11/2017 0414   BILITOT 0.3 02/11/2017 0414   GFRNONAA >60 02/11/2017 0414   GFRAA >60 02/11/2017 0414   Lipase     Component Value Date/Time   LIPASE 35 01/28/2017 1120       Studies/Results: No results found.  Anti-infectives: Anti-infectives    Start     Dose/Rate Route Frequency Ordered Stop   02/04/17 1500  cefTRIAXone (ROCEPHIN) 2 g in dextrose 5 % 50 mL IVPB     2 g 100 mL/hr over 30 Minutes Intravenous Every 24 hours 02/04/17 1354     02/04/17 1400  metroNIDAZOLE (FLAGYL) IVPB 500 mg     500 mg 100 mL/hr over 60 Minutes Intravenous Every 8 hours 02/04/17 1354     01/31/17 0800  piperacillin-tazobactam (ZOSYN) IVPB 3.375 g  Status:  Discontinued     3.375 g 12.5 mL/hr over 240 Minutes Intravenous Every 8 hours 01/31/17 0756 02/04/17 1351   01/28/17 2100  piperacillin-tazobactam (ZOSYN) IVPB 3.375 g  Status:  Discontinued     3.375 g 12.5 mL/hr over 240 Minutes Intravenous Every 8 hours 01/28/17 1333 01/31/17 0756   01/28/17 1230  piperacillin-tazobactam (ZOSYN) IVPB 3.375 g     3.375 g 100 mL/hr over 30 Minutes Intravenous  Once 01/28/17 1219 01/29/17 0758       Assessment/Plan Acute appendicitis  with rupture, admitted 01/28/17 - S/P perc drainage by Dr. Grace Isaac 9/11: 10 Fr drain LLQ, 10 Fr Drain R mid-abdomen - S/P perc drainage by Dr. Bonnielee Haff 9/13: R transgluteal drain. Culture grew E coli  - WBC trending down 11.2 from 12.8, TMAX 99.1  ID - Zosyn 9/10>>9/17; Ceftriaxone/Flagyl 9/17>>day#8 FEN - regular diet VTE - heparin Foley - none  Plan - Continues to have purulent drainage from drain. WBC trending down. Tolerating small amount of diet and abdominal pain well controlled. Transition to oral pain medication.  Will discuss possible transition to oral antibiotics with MD, and when to repeat CT scan.   LOS: 14 days    Franne Forts , [facility] Surgery 02/11/2017, 8:31 AM Pager: [phone] Consults: [phone] Mon-Fri 7:00 am-4:30  pm Sat-Sun 7:00 am-11:30 am

## 2017-02-11 NOTE — Progress Notes (Signed)
Nutrition Follow-up  DOCUMENTATION CODES:   Not applicable  INTERVENTION:  - Recommend measure weight (last recorded 09/12) - Continue Ensure Enlive po BID, each supplement provides 350 kcal and 20 grams of protein  NUTRITION DIAGNOSIS:   Inadequate oral intake related to poor appetite as evidenced by per patient/family report.  Progressing  GOAL:   Patient will meet greater than or equal to 90% of their needs  Progressing  MONITOR:   PO intake, Supplement acceptance, Labs, Weight trends, I & O's  ASSESSMENT:   21 year old white male with no previous past medical history who presented to [facility] emergency department [facility] with a chief complaint of 4 days of abdominal pain. The patient reports right lower quadrant abdominal pain that started 9/7. Pain described as sharp, constant right lower quadrant pain with intermittent radiation to central lower abdomen. Denies aggravating or alleviating factors. Associated symptoms include abdominal bloating, belching, hiccups, cold sweats, nausea, and vomiting. The patient reports a small loose bowel movement this morning but states bowel function has been irregular. He initially thought he had the flu and attempted to take a cold and flu medication that he got over-the-counter with little relief of symptoms. Due to persistent pain and intolerance of oral intake he presented to the emergency department for evaluation.   TPN turned off. Pt's diet advanced to Regular 02/08/17.  Pt reports an increase in appetite and tolerating diet. Reports consuming 100% of breakfast this morning.  Per meal completion percentages pt consumed 100% of all 3 meals 09/22. Pt reports parents brought outside food for him to consume yesterday. Pt reports N/V has subsided. Pt reports enjoying Ensure and consuming them when he wants to.  Labs reviewed; Phosphorus 5.3 Medications reviewed; Zofran  Diet Order:  Diet regular Room  service appropriate? Yes; Fluid consistency: Thin  Skin:  Reviewed, no issues  Last BM:  02/10/17  Height:   Ht Readings from Last 1 Encounters:  02/01/17  (1.676 m)    Weight:   Wt Readings from Last 1 Encounters:  01/30/17 192 lb 3.9 oz (87.2 kg)    Ideal Body Weight:  63.18 kg  BMI:  Body mass index is 31.5 kg/m.  Estimated Nutritional Needs:   Kcal:  2225-2460 (28-31 kcal/kg)  Protein:  110-127 grams (1.4-1.6 grams/kg)  Fluid:  >/= 2.2 L/day  EDUCATION NEEDS:   No education needs identified at this time  Fransisca Kaufmann, MS, RDN, LDN 02/11/2017 10:38 AM

## 2017-02-11 NOTE — Progress Notes (Signed)
    Referring Physician(s): Martin,M  Supervising Physician: Richarda Overlie  Patient Status:  [facility] - In-pt  Chief Complaint:  Appendiceal abscess  Subjective: Pt doing ok; has some mild bilateral pelvic soreness; denies N/V; ambulating ; tolerating diet   Allergies: Patient has no known allergies.  Medications: Prior to Admission medications   Medication Sig Start Date End Date Taking? Authorizing Provider  ibuprofen (ADVIL,MOTRIN) 200 MG tablet Take 400 mg by mouth every 6 (six) hours as needed for moderate pain or cramping.   Yes [provider]     Vital Signs: BP (!) 110/56 (BP Location: Left Arm)   Pulse 67   Temp 98.6 F (37 C) (Oral)   Resp 18   Ht  (1.676 m)   Wt 192 lb 3.9 oz (87.2 kg)   SpO2 98%   BMI 31.50 kg/m   Physical Exam RLQ drain intact, dressing dry, about 25 cc turbid pink fluid in JP bulb, site mildly tender  Imaging: No results found.  Labs:  CBC:  Recent Labs  02/08/17 0405 02/09/17 0417 02/10/17 1030 02/11/17 0414  WBC 19.1* 17.2* 12.8* 11.2*  HGB 9.8* 10.4* 10.2* 10.2*  HCT 29.1* 30.7* 31.1* 30.7*  PLT 537* 594* 538* 516*    COAGS:  Recent Labs  01/29/17 0452  INR 1.15    BMP:  Recent Labs  02/07/17 0409 02/08/17 0405 02/10/17 0422 02/11/17 0414  NA 134* 136 137 137  K 3.7 3.9 4.3 4.3  CL 103 104 105 105  CO2 GLUCOSE 136* 115* 84 94  BUN CALCIUM 8.1* 8.3* 8.7* 8.7*  CREATININE 0.77 0.79 0.81 0.88  GFRNONAA >60 >60 >60 >60  GFRAA >60 >60 >60 >60    LIVER FUNCTION TESTS:  Recent Labs  01/31/17 0459 02/04/17 0339 02/07/17 0409 02/11/17 0414  BILITOT 0.8 0.6 0.5 0.3  AST 41 49* 18 17  ALT 45 93* 38 31  ALKPHOS 69 103 80 80  PROT 5.8* 6.8 6.4* 6.7  ALBUMIN 2.3* 2.7* 2.7* 2.8*    Assessment and Plan: Pt with hx ruptured appendicitis/appendiceal abscess(es); currently with RLQ drain (placed 9/18); afebrile; WBC 11.2(12.8), hgb stable; creat nl; rec f/u CT  /drain inj this week either as IP or OP (IR clinic)   Electronically Signed: D. Jeananne Rama, PA-C 02/11/2017, 9:22 AM   I spent a total of 15 minutes at the the patient's bedside AND on the patient's hospital floor or unit, greater than 50% of which was counseling/coordinating care for pelvic abscess drain     Patient ID: Adam Mcdowell, male   DOB: 28-Dec-1995, 21 y.o.   MRN: [id number]

## 2017-02-12 ENCOUNTER — Other Ambulatory Visit: Payer: Self-pay | Admitting: Radiology

## 2017-02-12 DIAGNOSIS — K3533 Acute appendicitis with perforation and localized peritonitis, with abscess: Secondary | ICD-10-CM

## 2017-02-12 LAB — CBC
HEMATOCRIT: 31.5 % — AB (ref 39.0–52.0)
HEMOGLOBIN: 10.5 g/dL — AB (ref 13.0–17.0)
MCH: 28.8 pg (ref 26.0–34.0)
MCHC: 33.3 g/dL (ref 30.0–36.0)
MCV: 86.5 fL (ref 78.0–100.0)
Platelets: 495 10*3/uL — ABNORMAL HIGH (ref 150–400)
RBC: 3.64 MIL/uL — AB (ref 4.22–5.81)
RDW: 13.6 % (ref 11.5–15.5)
WBC: 10 10*3/uL (ref 4.0–10.5)

## 2017-02-12 MED ORDER — ENSURE ENLIVE PO LIQD: 237.0000 mL | Freq: Two times a day (BID) | ORAL | 12 refills | Status: DC

## 2017-02-12 MED ORDER — METHOCARBAMOL 500 MG PO TABS: 500.0000 mg | ORAL_TABLET | Freq: Three times a day (TID) | ORAL | 0 refills | Status: DC

## 2017-02-12 MED ORDER — OXYCODONE HCL 5 MG PO TABS: 5.0000 mg | ORAL_TABLET | ORAL | 0 refills | Status: DC | PRN

## 2017-02-12 MED ORDER — SACCHAROMYCES BOULARDII 250 MG PO CAPS: 250.0000 mg | ORAL_CAPSULE | Freq: Two times a day (BID) | ORAL | Status: DC

## 2017-02-12 MED ORDER — SODIUM CHLORIDE 0.9% FLUSH: INTRAVENOUS | 1 refills | Status: DC

## 2017-02-12 MED ORDER — AMOXICILLIN-POT CLAVULANATE 875-125 MG PO TABS: 1.0000 | ORAL_TABLET | Freq: Two times a day (BID) | ORAL | 0 refills | Status: DC

## 2017-02-12 NOTE — Discharge Summary (Signed)
[facility] Discharge Summary   Patient ID: Adam Mcdowell MRN: [id number] DOB/AGE: 1995/08/15 21 y.o.  Admit date: 01/28/2017 Discharge date: 02/12/2017  Admitting Diagnosis: Acute appendicitis with rupture  Discharge Diagnosis Patient Active Problem List   Diagnosis Date Noted  . Acute appendicitis with rupture 01/28/2017    Consultants Interventional radiology  Imaging: No results found.  Procedures Dr. Grace Isaac (01/29/17) - Percutaneous drainage abdominal abscess x2 Dr. Bonnielee Haff (01/31/17) - Percutaneous drainage abdominal abscess  Hospital Course:  Adam Mcdowell is a 21yo male who presented to [facility] 9/10 with 4 days of abdominal pain.  Workup showed leukocytosis and CT scan with a dilated appendix with a appendicolith at the base associated with inflammatory stranding, and two large intra-abdominal abscesses. Patient was admitted for bowel rest and IV antibiotics. Interventional radiology consulted and placed 2 percutaneous drains in each of the abscesses on 9/11. His WBC continued to rise and repeat CT scan showed increased size of the pelvic abscess, therefore he received an additional drain on 9/13. Cultures grew E coli. Antibiotics were switched to ceftriaxone and flagyl. Due to prolonged NPO status he was started on TPN 9/18. Repeat CT scan showed resolution of 2 fluid collections therefore 2 of his drains were removed and the remaining drain was upsized. Patient's bowel function slowly returned and WBC returned to WNL. Diet was advanced as tolerated and TPN was stopped. On 9/25 the patient was voiding well, tolerating diet, ambulating well, pain well controlled, vital signs stable and felt stable for discharge home.  He will go home with 1 drain and on 10 days of augmentin. Patient will follow up in the drain clinic later this week and with Dr. Daphine Deutscher within 1-2 weeks. He knows to call with questions or concerns.      Physical Exam: Gen:  Alert, NAD HEENT: EOM's  intact, pupils equal and round Card:  RRR, no M/G/R heard Pulm:  CTAB, no W/R/R, effort normal Abd: Soft, ND, NT, +BS, no HSM, no hernia, drain with purulent white/pink tinged fluid in bulb Ext:  No erythema, edema, or tenderness BUE/BLE  Psych: A&Ox3  Skin: no rashes noted, warm and dry   Allergies as of 02/12/2017   No Known Allergies     Medication List    TAKE these medications   amoxicillin-clavulanate 875-125 MG tablet Commonly known as:  AUGMENTIN Take 1 tablet by mouth every 12 (twelve) hours.   feeding supplement (ENSURE ENLIVE) Liqd Take 237 mLs by mouth 2 (two) times daily between meals.   ibuprofen 200 MG tablet Commonly known as:  ADVIL,MOTRIN Take 400 mg by mouth every 6 (six) hours as needed for moderate pain or cramping.   methocarbamol 500 MG tablet Commonly known as:  ROBAXIN Take 1 tablet (500 mg total) by mouth 3 (three) times daily.   oxyCODONE 5 MG immediate release tablet Commonly known as:  Oxy IR/ROXICODONE Take 1 tablet (5 mg total) by mouth every 4 (four) hours as needed for moderate pain or severe pain.   saccharomyces boulardii 250 MG capsule Commonly known as:  FLORASTOR Take 1 capsule (250 mg total) by mouth 2 (two) times daily. You can get a probiotic over the counter.   sodium chloride flush 0.9 % Soln Commonly known as:  NS Flush drain with 5 cc sterile saline every day            Discharge Care Instructions        Start     Ordered   02/12/17 0000  amoxicillin-clavulanate (AUGMENTIN) 875-125 MG tablet  Every 12 hours     02/12/17 1129   02/12/17 0000  feeding supplement, ENSURE ENLIVE, (ENSURE ENLIVE) LIQD  2 times daily between meals     02/12/17 1129   02/12/17 0000  methocarbamol (ROBAXIN) 500 MG tablet  3 times daily     02/12/17 1129   02/12/17 0000  oxyCODONE (OXY IR/ROXICODONE) 5 MG immediate release tablet  Every 4 hours PRN     02/12/17 1129   02/12/17 0000  saccharomyces boulardii (FLORASTOR) 250 MG capsule  2  times daily     02/12/17 1129   02/12/17 0000  Discharge patient    Question Answer Comment  Discharge disposition 01-Home or Self Care   Discharge patient date 02/12/2017      02/12/17 1129   02/12/17 0000  sodium chloride flush (NS) 0.9 % SOLN     02/12/17 1129   02/12/17 0000  PICC line removal     02/12/17 1129   02/10/17 0000  IR Radiologist Eval & Mgmt    Question Answer Comment  Reason for Exam (SYMPTOM  OR DIAGNOSIS REQUIRED) RLQ abscess drain placed 9/11---need CT and injection 7 days---follow Dr Grace Isaac   Preferred Imaging Location? [facility]      02/10/17 [phone]       Follow-up Information    Simonne Come, MD Follow up in 1 week(s).   Specialty:  Interventional Radiology Why:  pt will hear from IR [facility] for 1 week follow up date and time; call [phone] if questions or concerns Contact information: [address] [place] [address] [phone]        Luretha Murphy MD. Go on 02/27/2017.   Why:  Your appointment is 02/27/17 at 10:45AM. Please arrive 30 minutes prior to your appointment to check in and fill out paperwork. Contact information: [facility] [address] [place], [place] [address]  Phone number: [phone]          Signed: Franne Forts, [facility] Surgery 02/12/2017, 11:30 AM Pager: [phone] Consults: [phone] Mon-Fri 7:00 am-4:30 pm Sat-Sun 7:00 am-11:30 am

## 2017-02-13 ENCOUNTER — Other Ambulatory Visit: Payer: Self-pay | Admitting: Surgery

## 2017-02-13 DIAGNOSIS — K3533 Acute appendicitis with perforation and localized peritonitis, with abscess: Secondary | ICD-10-CM

## 2017-02-18 DIAGNOSIS — K56609 Unspecified intestinal obstruction, unspecified as to partial versus complete obstruction: Secondary | ICD-10-CM

## 2017-02-18 HISTORY — DX: Unspecified intestinal obstruction, unspecified as to partial versus complete obstruction: K56.609

## 2017-02-21 ENCOUNTER — Ambulatory Visit
Admission: RE | Admit: 2017-02-21 | Discharge: 2017-02-21 | Disposition: A | Discharge status: Home / Self Care | Payer: BLUE CROSS/BLUE SHIELD | Source: Ambulatory Visit | Attending: Radiology | Admitting: Radiology

## 2017-02-21 ENCOUNTER — Ambulatory Visit
Admission: RE | Admit: 2017-02-21 | Discharge: 2017-02-21 | Disposition: A | Discharge status: Home / Self Care | Payer: BLUE CROSS/BLUE SHIELD | Source: Ambulatory Visit | Attending: Surgery | Admitting: Surgery

## 2017-02-21 DIAGNOSIS — K3533 Acute appendicitis with perforation and localized peritonitis, with abscess: Secondary | ICD-10-CM

## 2017-02-21 HISTORY — PX: IR RADIOLOGIST EVAL & MGMT: IMG5224

## 2017-02-21 MED ORDER — IOPAMIDOL (ISOVUE-300) INJECTION 61%: 100.0000 mL | Freq: Once | INTRAVENOUS | Status: DC | PRN

## 2017-02-21 NOTE — Progress Notes (Signed)
Referring Physician(s): Dr Sheron Nightingale  Chief Complaint: The patient is seen in follow up today s/p  Ruptured appendix abscess Original drains (2) placed 01/29/2017; Additional drain placed 01/31/2017. Al removed and new drain placed 02/05/2017  History of present illness:  Pt doing well Plan for appendectomy with [facility] 02/27/17 Continues antibiotic Output is minimal; flushing daily Denies pain Denies N/V; fever or chills  No past medical history on file.  No past surgical history on file.  Allergies: Patient has no known allergies.  Medications: Prior to Admission medications   Medication Sig Start Date End Date Taking? Authorizing Provider  amoxicillin-clavulanate (AUGMENTIN) 875-125 MG tablet Take 1 tablet by mouth every 12 (twelve) hours. 02/12/17   Meuth, Brooke A, PA-C  feeding supplement, ENSURE ENLIVE, (ENSURE ENLIVE) LIQD Take 237 mLs by mouth 2 (two) times daily between meals. 02/12/17   Meuth, Brooke A, PA-C  ibuprofen (ADVIL,MOTRIN) 200 MG tablet Take 400 mg by mouth every 6 (six) hours as needed for moderate pain or cramping.    [provider]  methocarbamol (ROBAXIN) 500 MG tablet Take 1 tablet (500 mg total) by mouth 3 (three) times daily. 02/12/17   Meuth, Brooke A, PA-C  oxyCODONE (OXY IR/ROXICODONE) 5 MG immediate release tablet Take 1 tablet (5 mg total) by mouth every 4 (four) hours as needed for moderate pain or severe pain. 02/12/17   Meuth, Lina Sar, PA-C  saccharomyces boulardii (FLORASTOR) 250 MG capsule Take 1 capsule (250 mg total) by mouth 2 (two) times daily. You can get a probiotic over the counter. 02/12/17   Meuth, Brooke A, PA-C  sodium chloride flush (NS) 0.9 % SOLN Flush drain with 5 cc sterile saline every day 02/12/17   Franne Forts, PA-C     Family History  Problem Relation Age of Onset  . Hypertension Mother     Social History   Social History  . Marital status: Single    Spouse name: N/A  . Number of children: N/A  . Years of  education: N/A   Social History Main Topics  . Smoking status: Current Every Day Smoker    Types: E-cigarettes  . Smokeless tobacco: Never Used  . Alcohol use No  . Drug use: No  . Sexual activity: Not on file   Other Topics Concern  . Not on file   Social History Narrative  . No narrative on file     Vital Signs: BP 112/63   Pulse 80   Temp 98.5 F (36.9 C)   SpO2 99%   Physical Exam  Constitutional: He is oriented to person, place, and time.  Abdominal: Soft.  Musculoskeletal: Normal range of motion.  Neurological: He is alert and oriented to person, place, and time.  Skin: Skin is warm and dry.  Site is clean and dry NT no bleeding OP in bag is scant and serous CT reveals resolution of abscess  Drain injection shows resolution of abscess; no communication to bowel  Drain removed without complication   Psychiatric: He has a normal mood and affect. His behavior is normal.  Nursing note and vitals reviewed.   Imaging: Ct Abdomen Pelvis W Contrast  Result Date: 02/21/2017 CLINICAL DATA:  History of ruptured appendicitis, post 2 percutaneous drainage catheter placement on 01/29/2017, right trans gluteal approach percutaneous drainage catheter placement on 01/31/2017 and fluoroscopic guided exchange and repositioning of the percutaneous drainage catheter on 02/05/2017. Patient presents today to the interventional radiology drain Clinic for drainage catheter evaluation and management.  EXAM: CT ABDOMEN AND PELVIS WITH CONTRAST TECHNIQUE: Multidetector CT imaging of the abdomen and pelvis was performed using the standard protocol following bolus administration of intravenous contrast. CONTRAST:  100 cc Isovue 300 COMPARISON:  CT abdomen and pelvis - 02/05/2017; 01/28/2017; CT-guided percutaneous drainage catheter placement - 01/29/2017; 02/05/2017 CT-guided percutaneous drainage catheter repositioning exchange -02/05/2017 FINDINGS: Lower chest: Limited visualization of lower  thorax demonstrates a trace right-sided pleural effusion. No focal airspace opacities. Normal heart size.  No pericardial effusion. Hepatobiliary: Normal hepatic contour. There is a minimal amount of focal fatty infiltration adjacent to the fissure for the ligamentum teres. No discrete hepatic lesions. Normal appearance of the gallbladder given degree distention. No radiopaque gallstones. No intra extrahepatic biliary duct dilatation. No ascites. Pancreas: Normal appearance of the pancreas Spleen: Normal appearance of the spleen. Note is made of a small splenule. Adrenals/Urinary Tract: There is symmetric enhancement of the bilateral kidneys. No definite renal stones this postcontrast examination. No discrete renal lesions. No urine obstruction or perinephric stranding. Normal appearance the bilateral adrenal glands. Normal appearance the urinary bladder given underdistention. Stomach/Bowel: Interval removal of right trans gluteal and left lower abdominal/pelvic percutaneous drainage catheters. Unchanged positioning of percutaneous drainage catheter within the right mid hemiabdomen with complete resolution of residual right-sided perihepatic abscess. Significant decrease in size of residual ill-defined interloop abscesses within the lower abdomen and pelvis currently measuring approximately 1.1 x 1.1 x 1.8 cm (axial image 59, series 3, coronal image 62, series 601), previously, 6.2 x 2.7 cm and 1.4 x 1.6 x 1.3 cm (axial image 66, series 3; coronal image 73, series 601), previously, 4.2 x 2.6 cm, both of which are currently too small to allow for percutaneous drainage. No new or enlarging intraabdominal or pelvic fluid collections. Near normalization of the residual base of the appendix (coronal image 66, series 601). Moderate colonic stool burden without evidence of enteric obstruction. No pneumoperitoneum, pneumatosis or portal venous gas. Vascular/Lymphatic: Normal caliber of the abdominal aorta. The major branch  vessels of the abdominal aorta appear patent on this CTA examination. No bulky retroperitoneal, scattered mesenteric lymph nodes are numerous though individually not enlarged by size criteria with index mesenteric root lymph node measuring approximately 0.7 cm in greatest short axis diameter (image 43, series 3). No bulky retroperitoneal, mesenteric, pelvic or inguinal lymphadenopathy. Reproductive: Normal appearance of the prostate gland. No free fluid in the pelvic cul-de-sac. Other: Tiny mesenteric fat containing periumbilical hernia. Musculoskeletal: No acute or aggressive osseous abnormalities. IMPRESSION: 1. Unchanged positioning of solitary remaining percutaneous drainage catheter with complete resolution of perihepatic abscess. 2. Significant improvement in residual interloop abscesses within the lower abdomen and pelvis with residual collections now measuring 1.8 and 1.6 cm in dimensions, previously, 6.2 and 4.2 cm respectively, currently too small to allow for percutaneous drainage. 3. Near normalization of the residual base of the appendix without new or enlarging intraabdominal or pelvic fluid collections. 4. Moderate colonic stool burden without evidence of enteric obstruction. PLAN: Patient subsequently underwent percutaneous drainage catheter injection. Electronically Signed   By: Simonne Come M.D.   On: 02/21/2017 14:03    Labs:  CBC:  Recent Labs  02/09/17 0417 02/10/17 1030 02/11/17 0414 02/12/17 0447  WBC 17.2* 12.8* 11.2* 10.0  HGB 10.4* 10.2* 10.2* 10.5*  HCT 30.7* 31.1* 30.7* 31.5*  PLT 594* 538* 516* 495*    COAGS:  Recent Labs  01/29/17 0452  INR 1.15    BMP:  Recent Labs  02/07/17 0409 02/08/17 0405 02/10/17 0422 02/11/17  0414  NA 134* 136 137 137  K 3.7 3.9 4.3 4.3  CL 103 104 105 105  CO2 GLUCOSE 136* 115* 84 94  BUN CALCIUM 8.1* 8.3* 8.7* 8.7*  CREATININE 0.77 0.79 0.81 0.88  GFRNONAA >60 >60 >60 >60  GFRAA >60 >60 >60  >60    LIVER FUNCTION TESTS:  Recent Labs  01/31/17 0459 02/04/17 0339 02/07/17 0409 02/11/17 0414  BILITOT 0.8 0.6 0.5 0.3  AST 41 49* 18 17  ALT 45 93* 38 31  ALKPHOS 69 103 80 80  PROT 5.8* 6.8 6.4* 6.7  ALBUMIN 2.3* 2.7* 2.7* 2.8*    Assessment:  Appendiceal abscess drains placed 9/11; 9/13 and finally 9/18 Only 1 drain remains Todays CT shows resolution of abscess; injection shows NO communication to bowel Removal per Dr Randel Pigg complication Plan: finish antibiotic treatment Keep appt with [facility] for appendectomy 02/27/17  Signed: Adriana Quinby A, PA-C 02/21/2017, 2:41 PM   Please refer to Dr. Grace Isaac attestation of this note for management and plan.

## 2017-03-20 ENCOUNTER — Encounter: Payer: Self-pay | Admitting: Radiology

## 2017-03-26 ENCOUNTER — Emergency Department (HOSPITAL_COMMUNITY): Payer: BLUE CROSS/BLUE SHIELD

## 2017-03-26 ENCOUNTER — Other Ambulatory Visit: Payer: Self-pay

## 2017-03-26 ENCOUNTER — Inpatient Hospital Stay (HOSPITAL_COMMUNITY)
Admission: EM | Admit: 2017-03-26 | Discharge: 2017-04-01 | DRG: 388 | Disposition: A | Discharge status: Home / Self Care | Payer: BLUE CROSS/BLUE SHIELD | Attending: General Surgery | Admitting: General Surgery

## 2017-03-26 ENCOUNTER — Encounter (HOSPITAL_COMMUNITY): Payer: Self-pay | Admitting: Radiology

## 2017-03-26 DIAGNOSIS — Z720 Tobacco use: Secondary | ICD-10-CM

## 2017-03-26 DIAGNOSIS — F17210 Nicotine dependence, cigarettes, uncomplicated: Secondary | ICD-10-CM | POA: Diagnosis present

## 2017-03-26 DIAGNOSIS — K565 Intestinal adhesions [bands], unspecified as to partial versus complete obstruction: Secondary | ICD-10-CM | POA: Diagnosis present

## 2017-03-26 DIAGNOSIS — Z9119 Patient's noncompliance with other medical treatment and regimen: Secondary | ICD-10-CM

## 2017-03-26 DIAGNOSIS — K3532 Acute appendicitis with perforation and localized peritonitis, without abscess: Secondary | ICD-10-CM | POA: Diagnosis present

## 2017-03-26 DIAGNOSIS — F17290 Nicotine dependence, other tobacco product, uncomplicated: Secondary | ICD-10-CM | POA: Diagnosis present

## 2017-03-26 DIAGNOSIS — R509 Fever, unspecified: Secondary | ICD-10-CM

## 2017-03-26 DIAGNOSIS — Z91199 Patient's noncompliance with other medical treatment and regimen due to unspecified reason: Secondary | ICD-10-CM

## 2017-03-26 DIAGNOSIS — K56609 Unspecified intestinal obstruction, unspecified as to partial versus complete obstruction: Secondary | ICD-10-CM | POA: Diagnosis not present

## 2017-03-26 DIAGNOSIS — J690 Pneumonitis due to inhalation of food and vomit: Secondary | ICD-10-CM | POA: Diagnosis present

## 2017-03-26 DIAGNOSIS — Z0189 Encounter for other specified special examinations: Secondary | ICD-10-CM

## 2017-03-26 DIAGNOSIS — K651 Peritoneal abscess: Secondary | ICD-10-CM

## 2017-03-26 DIAGNOSIS — R109 Unspecified abdominal pain: Secondary | ICD-10-CM

## 2017-03-26 DIAGNOSIS — Z5329 Procedure and treatment not carried out because of patient's decision for other reasons: Secondary | ICD-10-CM

## 2017-03-26 LAB — COMPREHENSIVE METABOLIC PANEL
ALT: 31 U/L (ref 17–63)
ANION GAP: 11 (ref 5–15)
AST: 23 U/L (ref 15–41)
Albumin: 4.6 g/dL (ref 3.5–5.0)
Alkaline Phosphatase: 103 U/L (ref 38–126)
BUN: 10 mg/dL (ref 6–20)
CHLORIDE: 105 mmol/L (ref 101–111)
CO2: 25 mmol/L (ref 22–32)
Calcium: 10.1 mg/dL (ref 8.9–10.3)
Creatinine, Ser: 1.01 mg/dL (ref 0.61–1.24)
Glucose, Bld: 94 mg/dL (ref 65–99)
POTASSIUM: 4.1 mmol/L (ref 3.5–5.1)
Sodium: 141 mmol/L (ref 135–145)
TOTAL PROTEIN: 8.3 g/dL — AB (ref 6.5–8.1)
Total Bilirubin: 0.7 mg/dL (ref 0.3–1.2)

## 2017-03-26 LAB — CBC
HEMATOCRIT: 39 % (ref 39.0–52.0)
Hemoglobin: 13.2 g/dL (ref 13.0–17.0)
MCH: 28.7 pg (ref 26.0–34.0)
MCHC: 33.8 g/dL (ref 30.0–36.0)
MCV: 84.8 fL (ref 78.0–100.0)
Platelets: 307 10*3/uL (ref 150–400)
RBC: 4.6 MIL/uL (ref 4.22–5.81)
RDW: 13.3 % (ref 11.5–15.5)
WBC: 9.9 10*3/uL (ref 4.0–10.5)

## 2017-03-26 LAB — LIPASE, BLOOD: LIPASE: 22 U/L (ref 11–51)

## 2017-03-26 MED ORDER — DIPHENHYDRAMINE HCL 25 MG PO CAPS: 25.0000 mg | ORAL_CAPSULE | Freq: Four times a day (QID) | ORAL | Status: DC | PRN

## 2017-03-26 MED ORDER — MORPHINE SULFATE (PF) 4 MG/ML IV SOLN
2.0000 mg | INTRAVENOUS | Status: DC | PRN
  Administered 2017-03-27 – 2017-03-28 (×17): 4 mg via INTRAVENOUS
  Administered 2017-03-29 (×3): 2 mg via INTRAVENOUS
  Filled 2017-03-26 (×23): qty 1

## 2017-03-26 MED ORDER — HYDROCORTISONE 1 % EX CREA
1.0000 "application " | TOPICAL_CREAM | Freq: Three times a day (TID) | CUTANEOUS | Status: DC | PRN
  Filled 2017-03-26: qty 28

## 2017-03-26 MED ORDER — ONDANSETRON 4 MG PO TBDP
4.0000 mg | ORAL_TABLET | Freq: Four times a day (QID) | ORAL | Status: DC | PRN
  Filled 2017-03-26 (×2): qty 1

## 2017-03-26 MED ORDER — ACETAMINOPHEN 650 MG RE SUPP
650.0000 mg | Freq: Four times a day (QID) | RECTAL | Status: DC | PRN
  Administered 2017-03-27: 650 mg via RECTAL

## 2017-03-26 MED ORDER — MORPHINE SULFATE (PF) 2 MG/ML IV SOLN
2.0000 mg | INTRAVENOUS | Status: DC | PRN
  Administered 2017-03-26: 2 mg via INTRAVENOUS
  Administered 2017-03-26 (×2): 4 mg via INTRAVENOUS
  Filled 2017-03-26 (×2): qty 2
  Filled 2017-03-26: qty 1

## 2017-03-26 MED ORDER — ONDANSETRON HCL 4 MG/2ML IJ SOLN
4.0000 mg | Freq: Once | INTRAMUSCULAR | Status: AC
  Administered 2017-03-26: 4 mg via INTRAVENOUS
  Filled 2017-03-26: qty 2

## 2017-03-26 MED ORDER — DIPHENHYDRAMINE HCL 50 MG/ML IJ SOLN: 25.0000 mg | Freq: Four times a day (QID) | INTRAMUSCULAR | Status: DC | PRN

## 2017-03-26 MED ORDER — ALUM & MAG HYDROXIDE-SIMETH 200-200-20 MG/5ML PO SUSP: 30.0000 mL | Freq: Four times a day (QID) | ORAL | Status: DC | PRN

## 2017-03-26 MED ORDER — HYDRALAZINE HCL 20 MG/ML IJ SOLN: 10.0000 mg | INTRAMUSCULAR | Status: DC | PRN

## 2017-03-26 MED ORDER — METOPROLOL TARTRATE 5 MG/5ML IV SOLN
5.0000 mg | Freq: Four times a day (QID) | INTRAVENOUS | Status: DC | PRN
  Administered 2017-03-27: 5 mg via INTRAVENOUS
  Filled 2017-03-26: qty 5

## 2017-03-26 MED ORDER — BISACODYL 10 MG RE SUPP
10.0000 mg | Freq: Every day | RECTAL | Status: DC
  Filled 2017-03-26: qty 1

## 2017-03-26 MED ORDER — LIP MEDEX EX OINT
1.0000 "application " | TOPICAL_OINTMENT | Freq: Two times a day (BID) | CUTANEOUS | Status: DC
  Administered 2017-03-26 – 2017-03-31 (×6): 1 via TOPICAL
  Filled 2017-03-26 (×4): qty 7

## 2017-03-26 MED ORDER — ACETAMINOPHEN 325 MG PO TABS
650.0000 mg | ORAL_TABLET | Freq: Four times a day (QID) | ORAL | Status: DC | PRN
Start: 2017-03-26 — End: 2017-03-29
  Administered 2017-03-27 – 2017-03-28 (×2): 650 mg via ORAL
  Filled 2017-03-26 (×2): qty 2

## 2017-03-26 MED ORDER — LACTATED RINGERS IV SOLN
INTRAVENOUS | Status: DC
  Administered 2017-03-27 – 2017-03-31 (×7): via INTRAVENOUS

## 2017-03-26 MED ORDER — IOPAMIDOL (ISOVUE-300) INJECTION 61%
100.0000 mL | Freq: Once | INTRAVENOUS | Status: AC | PRN
  Administered 2017-03-26: 100 mL via INTRAVENOUS

## 2017-03-26 MED ORDER — MORPHINE SULFATE (PF) 4 MG/ML IV SOLN
4.0000 mg | Freq: Once | INTRAVENOUS | Status: AC
  Administered 2017-03-26: 4 mg via INTRAVENOUS
  Filled 2017-03-26: qty 1

## 2017-03-26 MED ORDER — MENTHOL 3 MG MT LOZG
1.0000 | LOZENGE | OROMUCOSAL | Status: DC | PRN
  Filled 2017-03-26: qty 9

## 2017-03-26 MED ORDER — ONDANSETRON HCL 4 MG/2ML IJ SOLN
4.0000 mg | Freq: Four times a day (QID) | INTRAMUSCULAR | Status: DC | PRN
  Administered 2017-03-26: 4 mg via INTRAVENOUS
  Filled 2017-03-26: qty 2

## 2017-03-26 MED ORDER — HYDROCORTISONE 2.5 % RE CREA
1.0000 "application " | TOPICAL_CREAM | Freq: Four times a day (QID) | RECTAL | Status: DC | PRN
  Filled 2017-03-26: qty 28.35

## 2017-03-26 MED ORDER — DIATRIZOATE MEGLUMINE & SODIUM 66-10 % PO SOLN
90.0000 mL | Freq: Once | ORAL | Status: AC
  Administered 2017-03-27: 90 mL via NASOGASTRIC
  Filled 2017-03-26: qty 90

## 2017-03-26 MED ORDER — SODIUM CHLORIDE 0.9 % IV SOLN
25.0000 mg | Freq: Four times a day (QID) | INTRAVENOUS | Status: DC | PRN
  Filled 2017-03-26: qty 1

## 2017-03-26 MED ORDER — GUAIFENESIN-DM 100-10 MG/5ML PO SYRP: 10.0000 mL | ORAL_SOLUTION | ORAL | Status: DC | PRN

## 2017-03-26 MED ORDER — DIPHENHYDRAMINE HCL 50 MG/ML IJ SOLN: 12.5000 mg | Freq: Four times a day (QID) | INTRAMUSCULAR | Status: DC | PRN

## 2017-03-26 MED ORDER — IOPAMIDOL (ISOVUE-300) INJECTION 61%
30.0000 mL | Freq: Once | INTRAVENOUS | Status: AC | PRN
  Administered 2017-03-26: 30 mL via ORAL

## 2017-03-26 MED ORDER — ONDANSETRON HCL 40 MG/20ML IJ SOLN
8.0000 mg | Freq: Four times a day (QID) | INTRAMUSCULAR | Status: DC | PRN
  Filled 2017-03-26: qty 4

## 2017-03-26 MED ORDER — MORPHINE SULFATE (PF) 4 MG/ML IV SOLN: 4.0000 mg | INTRAVENOUS | Status: DC | PRN

## 2017-03-26 MED ORDER — ONDANSETRON HCL 4 MG/2ML IJ SOLN
4.0000 mg | Freq: Four times a day (QID) | INTRAMUSCULAR | Status: DC | PRN
  Administered 2017-03-27 – 2017-03-29 (×9): 4 mg via INTRAVENOUS
  Filled 2017-03-26 (×9): qty 2

## 2017-03-26 MED ORDER — METOCLOPRAMIDE HCL 5 MG/ML IJ SOLN
5.0000 mg | Freq: Four times a day (QID) | INTRAMUSCULAR | Status: DC | PRN
  Administered 2017-03-26 – 2017-03-27 (×3): 10 mg via INTRAVENOUS
  Filled 2017-03-26 (×4): qty 2

## 2017-03-26 MED ORDER — IOPAMIDOL (ISOVUE-300) INJECTION 61%
INTRAVENOUS | Status: AC
  Administered 2017-03-26: 30 mL
  Filled 2017-03-26: qty 30

## 2017-03-26 MED ORDER — ENOXAPARIN SODIUM 40 MG/0.4ML ~~LOC~~ SOLN
40.0000 mg | Freq: Every day | SUBCUTANEOUS | Status: DC
  Administered 2017-03-27 – 2017-03-30 (×4): 40 mg via SUBCUTANEOUS
  Filled 2017-03-26 (×7): qty 0.4

## 2017-03-26 MED ORDER — MAGIC MOUTHWASH
15.0000 mL | Freq: Four times a day (QID) | ORAL | Status: DC | PRN
  Filled 2017-03-26: qty 15

## 2017-03-26 MED ORDER — PHENOL 1.4 % MT LIQD
1.0000 | OROMUCOSAL | Status: DC | PRN
  Administered 2017-03-29: 1 via OROMUCOSAL
  Filled 2017-03-26 (×3): qty 177

## 2017-03-26 MED ORDER — METHOCARBAMOL 1000 MG/10ML IJ SOLN
1000.0000 mg | Freq: Four times a day (QID) | INTRAVENOUS | Status: DC | PRN
  Filled 2017-03-26: qty 10

## 2017-03-26 MED ORDER — LACTATED RINGERS IV BOLUS (SEPSIS): 1000.0000 mL | Freq: Three times a day (TID) | INTRAVENOUS | Status: AC | PRN

## 2017-03-26 MED ORDER — ACETAMINOPHEN 650 MG RE SUPP
650.0000 mg | Freq: Four times a day (QID) | RECTAL | Status: DC | PRN
  Administered 2017-03-28 – 2017-03-29 (×2): 650 mg via RECTAL
  Filled 2017-03-26 (×3): qty 1

## 2017-03-26 MED ORDER — PROCHLORPERAZINE EDISYLATE 5 MG/ML IJ SOLN
5.0000 mg | INTRAMUSCULAR | Status: DC | PRN
  Administered 2017-03-27 – 2017-03-28 (×4): 10 mg via INTRAVENOUS
  Filled 2017-03-26 (×4): qty 2

## 2017-03-26 MED ORDER — IOPAMIDOL (ISOVUE-300) INJECTION 61%
INTRAVENOUS | Status: AC
  Filled 2017-03-26: qty 100

## 2017-03-26 NOTE — ED Notes (Signed)
Phone report given to JermynBianca, [place]

## 2017-03-26 NOTE — ED Notes (Signed)
First attempt made to call report, RN tied up at moment and will call back.

## 2017-03-26 NOTE — ED Notes (Signed)
Patient refused NG tube at this time. Pt state the doctor told him to hold it since he is not having any nausea and vomiting at this time.

## 2017-03-26 NOTE — ED Provider Notes (Signed)
[facility]-EMERGENCY DEPT Provider Note   CSN: [id number] Arrival date & time: 03/26/17  1016     History   Chief Complaint Chief Complaint  Patient presents with  . Abdominal Pain    HPI Adam RainJonathan Mcdowell is a 21 y.o. male.  HPI Worsening abdominal pain since yesterday with a hx of perforated appendicitis and intraabdominal abscess. Managed with percutanous drainage. Scheduled for delay appendectomy in Dec 2018. No hx of SBO. No fevers. No diarrhea. No blood in vomit. Pain is moderate in severity   History reviewed. No pertinent past medical history.  Patient Active Problem List   Diagnosis Date Noted  . Acute appendicitis with rupture 01/28/2017    Past Surgical History:  Procedure Laterality Date  . IR RADIOLOGIST EVAL & MGMT  02/21/2017       Home Medications    Prior to Admission medications   Medication Sig Start Date End Date Taking? Authorizing Provider  ibuprofen (ADVIL,MOTRIN) 200 MG tablet Take 400 mg by mouth every 6 (six) hours as needed for moderate pain or cramping.   Yes [provider]  sodium chloride flush (NS) 0.9 % SOLN Flush drain with 5 cc sterile saline every day 02/12/17  Yes Meuth, Brooke A, PA-C  amoxicillin-clavulanate (AUGMENTIN) 875-125 MG tablet Take 1 tablet by mouth every 12 (twelve) hours. Patient not taking: Reported on 03/26/2017 02/12/17   Carlena BjornstadMeuth, Brooke A, PA-C  feeding supplement, ENSURE ENLIVE, (ENSURE ENLIVE) LIQD Take 237 mLs by mouth 2 (two) times daily between meals. Patient not taking: Reported on 03/26/2017 02/12/17   Carlena BjornstadMeuth, Brooke A, PA-C  methocarbamol (ROBAXIN) 500 MG tablet Take 1 tablet (500 mg total) by mouth 3 (three) times daily. Patient not taking: Reported on 03/26/2017 02/12/17   Carlena BjornstadMeuth, Brooke A, PA-C  oxyCODONE (OXY IR/ROXICODONE) 5 MG immediate release tablet Take 1 tablet (5 mg total) by mouth every 4 (four) hours as needed for moderate pain or severe pain. Patient not taking: Reported on  03/26/2017 02/12/17   Franne FortsMeuth, Brooke A, PA-C  saccharomyces boulardii (FLORASTOR) 250 MG capsule Take 1 capsule (250 mg total) by mouth 2 (two) times daily. You can get a probiotic over the counter. Patient not taking: Reported on 03/26/2017 02/12/17   Franne FortsMeuth, Brooke A, PA-C    Family History Family History  Problem Relation Age of Onset  . Hypertension Mother     Social History Social History   Tobacco Use  . Smoking status: Current Every Day Smoker    Types: E-cigarettes  . Smokeless tobacco: Never Used  Substance Use Topics  . Alcohol use: No  . Drug use: No     Allergies   Patient has no known allergies.   Review of Systems Review of Systems  All other systems reviewed and are negative.    Physical Exam Updated Vital Signs BP 118/76 (BP Location: Left Arm)   Pulse 69   Temp 98 F (36.7 C) (Oral)   Resp 16   Ht 5\' 5"  (1.651 m)   Wt 77.1 kg (170 lb)   SpO2 100%   BMI 28.29 kg/m   Physical Exam  Constitutional: He is oriented to person, place, and time. He appears well-developed and well-nourished.  HENT:  Head: Normocephalic and atraumatic.  Eyes: EOM are normal.  Neck: Normal range of motion.  Cardiovascular: Normal rate, regular rhythm and normal heart sounds.  Pulmonary/Chest: Effort normal and breath sounds normal. No respiratory distress.  Abdominal: Soft. He exhibits distension. There is tenderness.  Musculoskeletal: Normal range of motion.  Neurological: He is alert and oriented to person, place, and time.  Skin: Skin is warm and dry.  Psychiatric: He has a normal mood and affect. Judgment normal.  Nursing note and vitals reviewed.    ED Treatments / Results  Labs (all labs ordered are listed, but only abnormal results are displayed) Labs Reviewed  COMPREHENSIVE METABOLIC PANEL - Abnormal; Notable for the following components:      Result Value   Total Protein 8.3 (*)    All other components within normal limits  LIPASE, BLOOD  CBC    URINALYSIS, ROUTINE W REFLEX MICROSCOPIC    EKG  EKG Interpretation None       Radiology Ct Abdomen Pelvis W Contrast  Result Date: 03/26/2017 CLINICAL DATA:  Epigastric pain starting yesterday. Bloating. Nausea and vomiting. Appendiceal rupture in early September and scheduled to have appendectomy 12/13. History of drains for appendiceal abscess. EXAM: CT ABDOMEN AND PELVIS WITH CONTRAST TECHNIQUE: Multidetector CT imaging of the abdomen and pelvis was performed using the standard protocol following bolus administration of intravenous contrast. CONTRAST:  100mL ISOVUE-300 IOPAMIDOL (ISOVUE-300) INJECTION 61%, 30mL ISOVUE-300 IOPAMIDOL (ISOVUE-300) INJECTION 61% COMPARISON:  02/21/2017 FINDINGS: Lower chest: Posterior right middle lobe and central right lower lobe patchy airspace disease on image 1/series 4. Normal heart size without pericardial or pleural effusion. Hepatobiliary: Normal liver. Normal gallbladder, without biliary ductal dilatation. Pancreas: Normal, without mass or ductal dilatation. Spleen: Normal in size, without focal abnormality. Adrenals/Urinary Tract: Normal adrenal glands. Normal kidneys, without hydronephrosis. Normal urinary bladder. Stomach/Bowel: Normal stomach, without wall thickening. Relatively decompressed distal colon. Normal appendiceal base with probable residual appendiceal tip distention, including at 11 millimeters on image 62/series 2. Normal caliber of proximal small bowel loops. Mid small bowel loops are dilated, many in a circular position. Although there is no specific evidence of complicating ischemia, there is small volume interloop fluid in this area. A transition is identified on images 51-41. Vascular/Lymphatic: Normal caliber of the aorta and branch vessels. No abdominopelvic adenopathy. Reproductive: Normal prostate. Other: Small volume cul-de-sac fluid and right abdominal ascites are likely secondary. No pneumatosis or free intraperitoneal air.  Musculoskeletal: No acute osseous abnormality. IMPRESSION: 1. Findings most consistent with mid small bowel obstruction. Given clinical history, most likely related to adhesions from prior perforated appendicitis. Given morphology and position of bowel loops, a closed loop obstruction cannot be excluded. Recommend clinical surveillance and ongoing plain film follow-up to confirm resolution. 2. No evidence of residual abscess. 3. Further improvement in appearance of the appendix. 4. Patchy right base airspace disease is new. Especially if the patient has had recent vomiting, aspiration is suspected. Electronically Signed   By: Jeronimo GreavesKyle  Talbot M.D.   On: 03/26/2017 14:09    Procedures Procedures (including critical care time)  Medications Ordered in ED Medications  iopamidol (ISOVUE-300) 61 % injection (not administered)  ondansetron (ZOFRAN) injection 4 mg (4 mg Intravenous Given 03/26/17 1137)  morphine 4 MG/ML injection 4 mg (4 mg Intravenous Given 03/26/17 1137)  iopamidol (ISOVUE-300) 61 % injection 30 mL (30 mLs Oral Contrast Given 03/26/17 1324)  iopamidol (ISOVUE-300) 61 % injection (30 mLs  Contrast Given 03/26/17 1156)  morphine 4 MG/ML injection 4 mg (4 mg Intravenous Given 03/26/17 1314)  iopamidol (ISOVUE-300) 61 % injection 100 mL (100 mLs Intravenous Contrast Given 03/26/17 1324)     Initial Impression / Assessment and Plan / ED Course  I have reviewed the triage vital signs and the nursing notes.  Pertinent labs & imaging results that were available during my care of the patient were reviewed by me and considered in my medical decision making (see chart for details).     SBO. No intraabdominal abscess. NG tube now. Surgery to admit  Final Clinical Impressions(s) / ED Diagnoses   Final diagnoses:  None    ED Discharge Orders    None       Azalia Bilis, MD 03/26/17 1418

## 2017-03-26 NOTE — ED Triage Notes (Signed)
Epigastric pain that started yesterday. +Bloating/nausea and vomiting. Patient states that he had his appendix rupture early September of this year and is scheduled to have appendix removed 12/13.

## 2017-03-26 NOTE — H&P (Signed)
[facility] Consult/Admission Note  Adam Mcdowell 1996-01-09  [id number].    Requesting MD: Dr. Venora Maples Chief Complaint/Reason for Consult: SBO  HPI:   Patient is a 21 year old otherwise healthy male who had acute appendicitis with perforation back in early September with multiple drain placement and plans for appendectomy on 05/02/17 with Dr. Hassell Done who presented to the emergency department with complaints of abdominal pain and one episode of vomiting. Patient states he wasn't feeling great on Sunday and had increased belching. Yesterday patient began having aggressively worsening abdominal pain and bloating with 1 episode of emesis today. Patient denies nausea. He states his abdominal pain is in the upper abdomen, constant, crampy in nature, sometimes severe, nonradiating. He took 6 Advil without relief. His father is at bedside. Patient had associated chills. He denies fever, blood in his vomit, diarrhea, chest pain, shortness of breath. His last normal bowel movement was yesterday. He still having a small amount flatus. Patient is a Electronics engineer.  ED course: CT scan showed findings most consistent with mid small bowel obstruction. Patient is afebrile, not tachycardic, WBC WNL, no peritonitis on exam.   ROS:  Review of Systems  Constitutional: Positive for chills. Negative for diaphoresis and fever.  Respiratory: Negative for cough and shortness of breath.   Cardiovascular: Negative for chest pain.  Gastrointestinal: Positive for abdominal pain and vomiting. Negative for blood in stool, diarrhea and nausea.  Genitourinary: Negative for dysuria and hematuria.  Neurological: Negative for dizziness and loss of consciousness.  All other systems reviewed and are negative.    Family History  Problem Relation Age of Onset  . Hypertension Mother     History reviewed. No pertinent past medical history.  Past Surgical History:  Procedure Laterality Date  . IR RADIOLOGIST  EVAL & MGMT  02/21/2017    Social History:  reports that he has been smoking e-cigarettes.  he has never used smokeless tobacco. He reports that he does not drink alcohol or use drugs.  Allergies: No Known Allergies   (Not in a hospital admission)  Blood pressure 118/76, pulse 69, temperature 98 F (36.7 C), temperature source Oral, resp. rate 16, height 5' 5"  (1.651 m), weight 170 lb (77.1 kg), SpO2 100 %.  Physical Exam  Constitutional: He is oriented to person, place, and time and well-developed, well-nourished, and in no distress. No distress.  HENT:  Head: Normocephalic and atraumatic.  Nose: Nose normal.  Mouth/Throat: Oropharynx is clear and moist. No oropharyngeal exudate.  Eyes: Conjunctivae are normal. Pupils are equal, round, and reactive to light. Right eye exhibits no discharge. Left eye exhibits no discharge. No scleral icterus.  Neck: Normal range of motion. Neck supple. No tracheal deviation present. No thyromegaly present.  Cardiovascular: Normal rate, regular rhythm, normal heart sounds and intact distal pulses. Exam reveals no gallop and no friction rub.  No murmur heard. Pulses:      Radial pulses are 2+ on the right side, and 2+ on the left side.       Dorsalis pedis pulses are 2+ on the right side, and 2+ on the left side.  Pulmonary/Chest: Effort normal and breath sounds normal. He has no wheezes. He has no rhonchi. He has no rales.  Abdominal: Normal appearance. He exhibits distension. Bowel sounds are hypoactive and tinkling. There is tenderness in the epigastric area and periumbilical area. There is no rigidity and no guarding. No hernia.  Well-healed scars from drain sites noted to right mid abdomen  Musculoskeletal: Normal range  of motion. He exhibits no edema or deformity.  Lymphadenopathy:    He has no cervical adenopathy.  Neurological: He is alert and oriented to person, place, and time.  Skin: Skin is warm and dry. No rash noted. He is not diaphoretic.   Psychiatric: Mood and affect normal.  Nursing note and vitals reviewed.   Results for orders placed or performed during the hospital encounter of 03/26/17 (from the past 48 hour(s))  Lipase, blood     Status: None   Collection Time: 03/26/17 11:26 AM  Result Value Ref Range   Lipase 22 11 - 51 U/L  Comprehensive metabolic panel     Status: Abnormal   Collection Time: 03/26/17 11:26 AM  Result Value Ref Range   Sodium 141 135 - 145 mmol/L   Potassium 4.1 3.5 - 5.1 mmol/L   Chloride 105 101 - 111 mmol/L   CO2 25 22 - 32 mmol/L   Glucose, Bld 94 65 - 99 mg/dL   BUN 10 6 - 20 mg/dL   Creatinine, Ser 1.01 0.61 - 1.24 mg/dL   Calcium 10.1 8.9 - 10.3 mg/dL   Total Protein 8.3 (H) 6.5 - 8.1 g/dL   Albumin 4.6 3.5 - 5.0 g/dL   AST 23 15 - 41 U/L   ALT 31 17 - 63 U/L   Alkaline Phosphatase 103 38 - 126 U/L   Total Bilirubin 0.7 0.3 - 1.2 mg/dL   GFR calc non Af Amer >60 >60 mL/min   GFR calc Af Amer >60 >60 mL/min    Comment: (NOTE) The eGFR has been calculated using the CKD EPI equation. This calculation has not been validated in all clinical situations. eGFR's persistently <60 mL/min signify possible Chronic Kidney Disease.    Anion gap 11 5 - 15  CBC     Status: None   Collection Time: 03/26/17 11:26 AM  Result Value Ref Range   WBC 9.9 4.0 - 10.5 K/uL   RBC 4.60 4.22 - 5.81 MIL/uL   Hemoglobin 13.2 13.0 - 17.0 g/dL   HCT 39.0 39.0 - 52.0 %   MCV 84.8 78.0 - 100.0 fL   MCH 28.7 26.0 - 34.0 pg   MCHC 33.8 30.0 - 36.0 g/dL   RDW 13.3 11.5 - 15.5 %   Platelets 307 150 - 400 K/uL   Ct Abdomen Pelvis W Contrast  Result Date: 03/26/2017 CLINICAL DATA:  Epigastric pain starting yesterday. Bloating. Nausea and vomiting. Appendiceal rupture in early September and scheduled to have appendectomy 12/13. History of drains for appendiceal abscess. EXAM: CT ABDOMEN AND PELVIS WITH CONTRAST TECHNIQUE: Multidetector CT imaging of the abdomen and pelvis was performed using the standard  protocol following bolus administration of intravenous contrast. CONTRAST:  115m ISOVUE-300 IOPAMIDOL (ISOVUE-300) INJECTION 61%, 368mISOVUE-300 IOPAMIDOL (ISOVUE-300) INJECTION 61% COMPARISON:  02/21/2017 FINDINGS: Lower chest: Posterior right middle lobe and central right lower lobe patchy airspace disease on image 1/series 4. Normal heart size without pericardial or pleural effusion. Hepatobiliary: Normal liver. Normal gallbladder, without biliary ductal dilatation. Pancreas: Normal, without mass or ductal dilatation. Spleen: Normal in size, without focal abnormality. Adrenals/Urinary Tract: Normal adrenal glands. Normal kidneys, without hydronephrosis. Normal urinary bladder. Stomach/Bowel: Normal stomach, without wall thickening. Relatively decompressed distal colon. Normal appendiceal base with probable residual appendiceal tip distention, including at 11 millimeters on image 62/series 2. Normal caliber of proximal small bowel loops. Mid small bowel loops are dilated, many in a circular position. Although there is no specific evidence of complicating ischemia,  there is small volume interloop fluid in this area. A transition is identified on images 51-41. Vascular/Lymphatic: Normal caliber of the aorta and branch vessels. No abdominopelvic adenopathy. Reproductive: Normal prostate. Other: Small volume cul-de-sac fluid and right abdominal ascites are likely secondary. No pneumatosis or free intraperitoneal air. Musculoskeletal: No acute osseous abnormality. IMPRESSION: 1. Findings most consistent with mid small bowel obstruction. Given clinical history, most likely related to adhesions from prior perforated appendicitis. Given morphology and position of bowel loops, a closed loop obstruction cannot be excluded. Recommend clinical surveillance and ongoing plain film follow-up to confirm resolution. 2. No evidence of residual abscess. 3. Further improvement in appearance of the appendix. 4. Patchy right base  airspace disease is new. Especially if the patient has had recent vomiting, aspiration is suspected. Electronically Signed   By: Abigail Miyamoto M.D.   On: 03/26/2017 14:09      Assessment/Plan  SPO - Admitted to our service - Likely 2/2 adhesions from previous perforated appendicitis and drain placement - Will initiate SPO protocol with NG tube and Gastrografin - NPO, IVF - No antibiotics indicated at this time - CT scan noted patchy right basilar airspace disease, will monitor O2, IS  Kalman Drape, [facility] 03/26/2017, 3:06 PM Pager: [phone] Consults: [phone] Mon-Fri 7:00 am-4:30 pm Sat-Sun 7:00 am-11:30 am

## 2017-03-27 ENCOUNTER — Inpatient Hospital Stay (HOSPITAL_COMMUNITY): Payer: BLUE CROSS/BLUE SHIELD

## 2017-03-27 DIAGNOSIS — Z5329 Procedure and treatment not carried out because of patient's decision for other reasons: Secondary | ICD-10-CM

## 2017-03-27 DIAGNOSIS — Z91199 Patient's noncompliance with other medical treatment and regimen due to unspecified reason: Secondary | ICD-10-CM

## 2017-03-27 LAB — CBC
HCT: 35.5 % — ABNORMAL LOW (ref 39.0–52.0)
Hemoglobin: 11.8 g/dL — ABNORMAL LOW (ref 13.0–17.0)
MCH: 28.3 pg (ref 26.0–34.0)
MCHC: 33.2 g/dL (ref 30.0–36.0)
MCV: 85.1 fL (ref 78.0–100.0)
PLATELETS: 275 10*3/uL (ref 150–400)
RBC: 4.17 MIL/uL — ABNORMAL LOW (ref 4.22–5.81)
RDW: 13.2 % (ref 11.5–15.5)
WBC: 8.6 10*3/uL (ref 4.0–10.5)

## 2017-03-27 LAB — BASIC METABOLIC PANEL
Anion gap: 7 (ref 5–15)
BUN: 12 mg/dL (ref 6–20)
CALCIUM: 9.2 mg/dL (ref 8.9–10.3)
CO2: 28 mmol/L (ref 22–32)
CREATININE: 1.04 mg/dL (ref 0.61–1.24)
Chloride: 105 mmol/L (ref 101–111)
GFR calc Af Amer: >60 mL/min (ref >60)
Glucose, Bld: 105 mg/dL — ABNORMAL HIGH (ref 65–99)
Potassium: 4.1 mmol/L (ref 3.5–5.1)
SODIUM: 140 mmol/L (ref 135–145)

## 2017-03-27 LAB — URINALYSIS, ROUTINE W REFLEX MICROSCOPIC
BILIRUBIN URINE: NEGATIVE
Glucose, UA: NEGATIVE mg/dL
Hgb urine dipstick: NEGATIVE
Ketones, ur: 5 mg/dL — AB
Leukocytes, UA: NEGATIVE
NITRITE: NEGATIVE
PH: 5 (ref 5.0–8.0)
Protein, ur: NEGATIVE mg/dL
SPECIFIC GRAVITY, URINE: 1.043 — AB (ref 1.005–1.030)

## 2017-03-27 MED ORDER — LACTATED RINGERS IV BOLUS (SEPSIS)
1000.0000 mL | Freq: Once | INTRAVENOUS | Status: AC
  Administered 2017-03-27: 1000 mL via INTRAVENOUS

## 2017-03-27 NOTE — Progress Notes (Signed)
MD paged. Pt asymptomatic.

## 2017-03-27 NOTE — Progress Notes (Signed)
Per Wells GuilesKelly Rayburn, PA NG tube not necessary at this time.

## 2017-03-27 NOTE — Progress Notes (Signed)
[facility] Progress Note     Subjective: CC: abdominal pain Patient states pain is currently not too bad and he is feeling better overall than when he came in. Pain mostly in upper-mid abdomen. Denies n/v. Unsure if he has passed any flatus, last BM Monday. Does not feel like abdomen is more distended.  Tmax 101.9  Objective: Vital signs in last 24 hours: Temp:  [98 F (36.7 C)-101.9 F (38.8 C)] 101.9 F (38.8 C) (11/07 0514) Pulse Rate:  [69-120] 120 (11/07 0514) Resp:  [16-18] 18 (11/07 0514) BP: (112-137)/(60-83) 137/65 (11/07 0514) SpO2:  [91 %-100 %] 98 % (11/07 0514) Weight:  [77.1 kg (170 lb)-79 kg (174 lb 2.6 oz)] 79 kg (174 lb 2.6 oz) (11/06 2234) Last BM Date: 03/25/17  Intake/Output from previous day: 11/06 0701 - 11/07 0700 In: 493.3 [I.V.:493.3] Out: 200 [Urine:200] Intake/Output this shift: No intake/output data recorded.  PE: Gen:  Alert, NAD, pleasant Card:  Regular rate and rhythm, pedal pulses 2+ BL Pulm:  Normal effort, clear to auscultation bilaterally Abd: Soft, minimally tender in epigastrium, distended, bowel sounds present, no HSM, no rebound or guarding Skin: warm and dry, no rashes  Psych: A&Ox3   Lab Results:  Recent Labs    03/26/17 1126 03/27/17 0421  WBC 9.9 8.6  HGB 13.2 11.8*  HCT 39.0 35.5*  PLT 307 275   BMET Recent Labs    03/26/17 1126 03/27/17 0421  NA 141 140  K 4.1 4.1  CL 105 105  CO2 25 28  GLUCOSE 94 105*  BUN 10 12  CREATININE 1.01 1.04  CALCIUM 10.1 9.2   PT/INR No results for input(s): LABPROT, INR in the last 72 hours. CMP     Component Value Date/Time   NA 140 03/27/2017 0421   K 4.1 03/27/2017 0421   CL 105 03/27/2017 0421   CO2 28 03/27/2017 0421   GLUCOSE 105 (H) 03/27/2017 0421   BUN 12 03/27/2017 0421   CREATININE 1.04 03/27/2017 0421   CALCIUM 9.2 03/27/2017 0421   PROT 8.3 (H) 03/26/2017 1126   ALBUMIN 4.6 03/26/2017 1126   AST 23 03/26/2017 1126   ALT 31 03/26/2017 1126    ALKPHOS 103 03/26/2017 1126   BILITOT 0.7 03/26/2017 1126   GFRNONAA >60 03/27/2017 0421   GFRAA >60 03/27/2017 0421   Lipase     Component Value Date/Time   LIPASE 22 03/26/2017 1126       Studies/Results: Ct Abdomen Pelvis W Contrast  Result Date: 03/26/2017 CLINICAL DATA:  Epigastric pain starting yesterday. Bloating. Nausea and vomiting. Appendiceal rupture in early September and scheduled to have appendectomy 12/13. History of drains for appendiceal abscess. EXAM: CT ABDOMEN AND PELVIS WITH CONTRAST TECHNIQUE: Multidetector CT imaging of the abdomen and pelvis was performed using the standard protocol following bolus administration of intravenous contrast. CONTRAST:  100mL ISOVUE-300 IOPAMIDOL (ISOVUE-300) INJECTION 61%, 30mL ISOVUE-300 IOPAMIDOL (ISOVUE-300) INJECTION 61% COMPARISON:  02/21/2017 FINDINGS: Lower chest: Posterior right middle lobe and central right lower lobe patchy airspace disease on image 1/series 4. Normal heart size without pericardial or pleural effusion. Hepatobiliary: Normal liver. Normal gallbladder, without biliary ductal dilatation. Pancreas: Normal, without mass or ductal dilatation. Spleen: Normal in size, without focal abnormality. Adrenals/Urinary Tract: Normal adrenal glands. Normal kidneys, without hydronephrosis. Normal urinary bladder. Stomach/Bowel: Normal stomach, without wall thickening. Relatively decompressed distal colon. Normal appendiceal base with probable residual appendiceal tip distention, including at 11 millimeters on image 62/series 2. Normal caliber of proximal small  bowel loops. Mid small bowel loops are dilated, many in a circular position. Although there is no specific evidence of complicating ischemia, there is small volume interloop fluid in this area. A transition is identified on images 51-41. Vascular/Lymphatic: Normal caliber of the aorta and branch vessels. No abdominopelvic adenopathy. Reproductive: Normal prostate. Other: Small  volume cul-de-sac fluid and right abdominal ascites are likely secondary. No pneumatosis or free intraperitoneal air. Musculoskeletal: No acute osseous abnormality. IMPRESSION: 1. Findings most consistent with mid small bowel obstruction. Given clinical history, most likely related to adhesions from prior perforated appendicitis. Given morphology and position of bowel loops, a closed loop obstruction cannot be excluded. Recommend clinical surveillance and ongoing plain film follow-up to confirm resolution. 2. No evidence of residual abscess. 3. Further improvement in appearance of the appendix. 4. Patchy right base airspace disease is new. Especially if the patient has had recent vomiting, aspiration is suspected. Electronically Signed   By: Jeronimo GreavesKyle  Talbot M.D.   On: 03/26/2017 14:09    Anti-infectives: Anti-infectives (From admission, onward)   None       Assessment/Plan Perforated appendicitis treated with drains and abx in early September Scheduled for interval appendectomy 05/02/17 with Dr. Daphine DeutscherMartin  SBO - Likely 2/2 adhesions from previous perforated appendicitis and drain placement - patient refused NGT but no n/v; PO gastrografin drunk around 0200 this AM - XR 1000 - NPO, IVF - WBC trending down, Tmax 101.9 - UA negative, no abscess on CT, will order CXR - CT scan noted patchy right basilar airspace disease, will monitor O2, IS  FEN: NPO, IVF  VTE: SCDs, lovenox ID: no [email or web address]  Plan: if patient worsens clinically or begins having n/v will need NGT. Films pending.   LOS: 1 day    Wells GuilesKelly Rayburn , [facility] 03/27/2017, 8:08 AM Pager: [phone] Consults: [phone] Mon-Fri 7:00 am-4:30 pm Sat-Sun 7:00 am-11:30 am

## 2017-03-28 ENCOUNTER — Inpatient Hospital Stay (HOSPITAL_COMMUNITY): Payer: BLUE CROSS/BLUE SHIELD

## 2017-03-28 LAB — CBC WITH DIFFERENTIAL/PLATELET
Basophils Absolute: 0 10*3/uL (ref 0.0–0.1)
Basophils Relative: 0 %
EOS PCT: 0 %
Eosinophils Absolute: 0 10*3/uL (ref 0.0–0.7)
HEMATOCRIT: 31.4 % — AB (ref 39.0–52.0)
Hemoglobin: 10.5 g/dL — ABNORMAL LOW (ref 13.0–17.0)
LYMPHS PCT: 17 %
Lymphs Abs: 0.9 10*3/uL (ref 0.7–4.0)
MCH: 28 pg (ref 26.0–34.0)
MCHC: 33.4 g/dL (ref 30.0–36.0)
MCV: 83.7 fL (ref 78.0–100.0)
MONO ABS: 0.7 10*3/uL (ref 0.1–1.0)
MONOS PCT: 13 %
NEUTROS ABS: 3.6 10*3/uL (ref 1.7–7.7)
Neutrophils Relative %: 70 %
PLATELETS: 243 10*3/uL (ref 150–400)
RBC: 3.75 MIL/uL — ABNORMAL LOW (ref 4.22–5.81)
RDW: 12.8 % (ref 11.5–15.5)
WBC: 5.2 10*3/uL (ref 4.0–10.5)

## 2017-03-28 LAB — C DIFFICILE QUICK SCREEN W PCR REFLEX
C Diff antigen: NEGATIVE
C Diff interpretation: NOT DETECTED
C Diff toxin: NEGATIVE

## 2017-03-28 LAB — BASIC METABOLIC PANEL
Anion gap: 9 (ref 5–15)
BUN: 10 mg/dL (ref 6–20)
CALCIUM: 8.1 mg/dL — AB (ref 8.9–10.3)
CO2: 25 mmol/L (ref 22–32)
CREATININE: 1.11 mg/dL (ref 0.61–1.24)
Chloride: 101 mmol/L (ref 101–111)
GFR calc Af Amer: >60 mL/min (ref >60)
GFR calc non Af Amer: >60 mL/min (ref >60)
GLUCOSE: 96 mg/dL (ref 65–99)
Potassium: 3.7 mmol/L (ref 3.5–5.1)
Sodium: 135 mmol/L (ref 135–145)

## 2017-03-28 MED ORDER — DIATRIZOATE MEGLUMINE & SODIUM 66-10 % PO SOLN
90.0000 mL | Freq: Once | ORAL | Status: AC
  Administered 2017-03-28: 90 mL via NASOGASTRIC
  Filled 2017-03-28: qty 90

## 2017-03-28 MED ORDER — SODIUM CHLORIDE 0.9 % IV SOLN
25.0000 mg | Freq: Four times a day (QID) | INTRAVENOUS | Status: DC | PRN
  Filled 2017-03-28: qty 1

## 2017-03-28 MED ORDER — AMPICILLIN-SULBACTAM SODIUM 3 (2-1) G IJ SOLR
3.0000 g | Freq: Four times a day (QID) | INTRAMUSCULAR | Status: DC
  Administered 2017-03-28 – 2017-04-01 (×16): 3 g via INTRAVENOUS
  Filled 2017-03-28 (×18): qty 3

## 2017-03-28 MED ORDER — METOCLOPRAMIDE HCL 5 MG/ML IJ SOLN: 5.0000 mg | Freq: Four times a day (QID) | INTRAMUSCULAR | Status: DC | PRN

## 2017-03-28 MED ORDER — DEXTROSE 5 % IV SOLN: 2.0000 g | INTRAVENOUS | Status: DC

## 2017-03-28 NOTE — Progress Notes (Signed)
[address] [place], [place] [address] Phone: [phone]  FAX: [phone]      Adam Mcdowell [id number] 1995/12/14  CARE TEAM:  PCP: [name]  Outpatient Care Team: Patient Care Team: [name], [name] as PCP - General (Family Medicine)  Inpatient Treatment Team: Treatment Team: Attending Provider: [name], Md, MD; Rounding Team: [name], Md, MD; Registered Nurse: [name], RN   Problem List:   Principal Problem:   SBO (small bowel obstruction) [facility]) Active Problems:   Acute appendicitis with rupture   Abscesses of abdominal cavity from complex perforated appendicitis   Tobacco abuse   Small bowel obstruction ([place])   Noncompliance by refusing intervention or support      * No surgery found *     Assessment  Guarded with temperature spikes and diarrhea  Plan:  -Rule out C. difficile.  Most likely just postobstructive diarrhea resolving bowel obstruction  -Spiking fevers of concern.  Start antibiotics.  Possible aspiration event with emesis prior to admission.  Do Unasyn.  No strong evidence of peritonitis.  CT scan without evidence of recurrent abscess.  Skeptically has a persistent closed loop obstruction in the absence of severe abdominal pain or peritonitis.  Follow closely.  I believe blood cultures were ordered last night.  Urinalysis is negative.  Repeat chest x-ray since concern of possible right mid lobe aspiration/pneumonitis  -VTE prophylaxis- SCDs, etc -mobilize as tolerated to help recovery  20 minutes spent in review, evaluation, examination, counseling, and coordination of care.  More than 50% of that time was spent in counseling.  Adin Hector, M.D., F.A.C.S. Gastrointestinal and Minimally Invasive Surgery [facility] [address], [place], [place] [address] [phone] Main / Paging   03/28/2017    Subjective: (Chief  complaint)  Felt tachycardic and lightheaded yesterday.  Better with IV fluids.  Spiking fevers though. Having many loose bowel movements. Sipping on clears.  Objective:  Vital signs:  Vitals:   03/28/17 0457 03/28/17 0608 03/28/17 0617 03/28/17 0646  BP: 132/70     Pulse: (!) 122     Resp: 18     Temp: (!) 101.1 F (38.4 C) (!) 103.1 F (39.5 C) (!) 102.9 F (39.4 C) (!) 103.2 F (39.6 C)  TempSrc: Oral Oral Oral Oral  SpO2: 97%     Weight:      Height:        Last BM Date: 03/27/17  Intake/Output   Yesterday:  11/07 0701 - 11/08 0700 In: 1540 [P.O.:240; I.V.:1300] Out: -  This shift:  No intake/output data recorded.  Bowel function:  Flatus: YES  BM:  YES  Drain: (No drain)   Physical Exam:  General: Pt awake/alert/oriented x4 in mild acute distress.  Tired and mildly sickly.  Not toxic Eyes: PERRL, normal EOM.  Sclera clear.  No icterus Neuro: CN II-XII intact w/o focal sensory/motor deficits. Lymph: No head/neck/groin lymphadenopathy Psych:  No delerium/psychosis/paranoia HENT: Normocephalic, Mucus membranes moist.  No thrush Neck: Supple, No tracheal deviation Chest: No chest wall pain w good excursion CV:  Pulses intact.  Regular rhythm MS: Normal AROM mjr joints.  No obvious deformity  Abdomen: Somewhat firm.  Moderately distended.  Mild upper abdominal tenderness.  No guarding.  No pain with bed shake or cough..  No evidence of peritonitis.  No incarcerated hernias.  Ext:   No deformity.  No mjr edema.  No cyanosis Skin: No petechiae /  purpura  Results:   Labs: Results for orders placed or performed during the hospital encounter of 03/26/17 (from the past 48 hour(s))  Lipase, blood     Status: None   Collection Time: 03/26/17 11:26 AM  Result Value Ref Range   Lipase 22 11 - 51 U/L  Comprehensive metabolic panel     Status: Abnormal   Collection Time: 03/26/17 11:26 AM  Result Value Ref Range   Sodium 141 135 - 145 mmol/L   Potassium  4.1 3.5 - 5.1 mmol/L   Chloride 105 101 - 111 mmol/L   CO2 25 22 - 32 mmol/L   Glucose, Bld 94 65 - 99 mg/dL   BUN 10 6 - 20 mg/dL   Creatinine, Ser 1.01 0.61 - 1.24 mg/dL   Calcium 10.1 8.9 - 10.3 mg/dL   Total Protein 8.3 (H) 6.5 - 8.1 g/dL   Albumin 4.6 3.5 - 5.0 g/dL   AST 23 15 - 41 U/L   ALT 31 17 - 63 U/L   Alkaline Phosphatase 103 38 - 126 U/L   Total Bilirubin 0.7 0.3 - 1.2 mg/dL   GFR calc non Af Amer >60 >60 mL/min   GFR calc Af Amer >60 >60 mL/min    Comment: (NOTE) The eGFR has been calculated using the CKD EPI equation. This calculation has not been validated in all clinical situations. eGFR's persistently <60 mL/min signify possible Chronic Kidney Disease.    Anion gap 11 5 - 15  CBC     Status: None   Collection Time: 03/26/17 11:26 AM  Result Value Ref Range   WBC 9.9 4.0 - 10.5 K/uL   RBC 4.60 4.22 - 5.81 MIL/uL   Hemoglobin 13.2 13.0 - 17.0 g/dL   HCT 39.0 39.0 - 52.0 %   MCV 84.8 78.0 - 100.0 fL   MCH 28.7 26.0 - 34.0 pg   MCHC 33.8 30.0 - 36.0 g/dL   RDW 13.3 11.5 - 15.5 %   Platelets 307 150 - 400 K/uL  Urinalysis, Routine w reflex microscopic     Status: Abnormal   Collection Time: 03/26/17 11:50 PM  Result Value Ref Range   Color, Urine YELLOW YELLOW   APPearance CLEAR CLEAR   Specific Gravity, Urine 1.043 (H) 1.005 - 1.030   pH 5.0 5.0 - 8.0   Glucose, UA NEGATIVE NEGATIVE mg/dL   Hgb urine dipstick NEGATIVE NEGATIVE   Bilirubin Urine NEGATIVE NEGATIVE   Ketones, ur 5 (A) NEGATIVE mg/dL   Protein, ur NEGATIVE NEGATIVE mg/dL   Nitrite NEGATIVE NEGATIVE   Leukocytes, UA NEGATIVE NEGATIVE  Basic metabolic panel     Status: Abnormal   Collection Time: 03/27/17  4:21 AM  Result Value Ref Range   Sodium 140 135 - 145 mmol/L   Potassium 4.1 3.5 - 5.1 mmol/L   Chloride 105 101 - 111 mmol/L   CO2 28 22 - 32 mmol/L   Glucose, Bld 105 (H) 65 - 99 mg/dL   BUN 12 6 - 20 mg/dL   Creatinine, Ser 1.04 0.61 - 1.24 mg/dL   Calcium 9.2 8.9 - 10.3 mg/dL    GFR calc non Af Amer >60 >60 mL/min   GFR calc Af Amer >60 >60 mL/min    Comment: (NOTE) The eGFR has been calculated using the CKD EPI equation. This calculation has not been validated in all clinical situations. eGFR's persistently <60 mL/min signify possible Chronic Kidney Disease.    Anion gap 7 5 - 15  CBC  Status: Abnormal   Collection Time: 03/27/17  4:21 AM  Result Value Ref Range   WBC 8.6 4.0 - 10.5 K/uL   RBC 4.17 (L) 4.22 - 5.81 MIL/uL   Hemoglobin 11.8 (L) 13.0 - 17.0 g/dL   HCT 35.5 (L) 39.0 - 52.0 %   MCV 85.1 78.0 - 100.0 fL   MCH 28.3 26.0 - 34.0 pg   MCHC 33.2 30.0 - 36.0 g/dL   RDW 13.2 11.5 - 15.5 %   Platelets 275 150 - 400 K/uL    Imaging / Studies: Ct Abdomen Pelvis W Contrast  Result Date: 03/26/2017 CLINICAL DATA:  Epigastric pain starting yesterday. Bloating. Nausea and vomiting. Appendiceal rupture in early September and scheduled to have appendectomy 12/13. History of drains for appendiceal abscess. EXAM: CT ABDOMEN AND PELVIS WITH CONTRAST TECHNIQUE: Multidetector CT imaging of the abdomen and pelvis was performed using the standard protocol following bolus administration of intravenous contrast. CONTRAST:  141m ISOVUE-300 IOPAMIDOL (ISOVUE-300) INJECTION 61%, 331mISOVUE-300 IOPAMIDOL (ISOVUE-300) INJECTION 61% COMPARISON:  02/21/2017 FINDINGS: Lower chest: Posterior right middle lobe and central right lower lobe patchy airspace disease on image 1/series 4. Normal heart size without pericardial or pleural effusion. Hepatobiliary: Normal liver. Normal gallbladder, without biliary ductal dilatation. Pancreas: Normal, without mass or ductal dilatation. Spleen: Normal in size, without focal abnormality. Adrenals/Urinary Tract: Normal adrenal glands. Normal kidneys, without hydronephrosis. Normal urinary bladder. Stomach/Bowel: Normal stomach, without wall thickening. Relatively decompressed distal colon. Normal appendiceal base with probable residual  appendiceal tip distention, including at 11 millimeters on image 62/series 2. Normal caliber of proximal small bowel loops. Mid small bowel loops are dilated, many in a circular position. Although there is no specific evidence of complicating ischemia, there is small volume interloop fluid in this area. A transition is identified on images 51-41. Vascular/Lymphatic: Normal caliber of the aorta and branch vessels. No abdominopelvic adenopathy. Reproductive: Normal prostate. Other: Small volume cul-de-sac fluid and right abdominal ascites are likely secondary. No pneumatosis or free intraperitoneal air. Musculoskeletal: No acute osseous abnormality. IMPRESSION: 1. Findings most consistent with mid small bowel obstruction. Given clinical history, most likely related to adhesions from prior perforated appendicitis. Given morphology and position of bowel loops, a closed loop obstruction cannot be excluded. Recommend clinical surveillance and ongoing plain film follow-up to confirm resolution. 2. No evidence of residual abscess. 3. Further improvement in appearance of the appendix. 4. Patchy right base airspace disease is new. Especially if the patient has had recent vomiting, aspiration is suspected. Electronically Signed   By: KyAbigail Miyamoto.D.   On: 03/26/2017 14:09   Dg Chest Port 1 View  Result Date: 03/27/2017 CLINICAL DATA:  SBO protocol, per pt some new recent cough, no other chest complaints EXAM: PORTABLE CHEST 1 VIEW COMPARISON:  None. FINDINGS: Platelike opacity within the right lower lung. Left lung is clear. No pleural effusion or pneumothorax seen. Heart size is upper normal. Osseous structures about the chest are unremarkable. IMPRESSION: Small platelike opacity at the right lung base, atelectasis versus pneumonia. Electronically Signed   By: StFranki Cabot.D.   On: 03/27/2017 11:33   Dg Abd Portable 1v-small Bowel Obstruction Protocol-initial, 8 Hr Delay  Result Date: 03/27/2017 CLINICAL DATA:   8hr delay small bowel obstruction protocol, still some abd discomfort, distention EXAM: PORTABLE ABDOMEN - 1 VIEW COMPARISON:  CT abdomen dated 03/26/2017 FINDINGS: Persistent dilated gas-filled loops of small bowel throughout the central abdomen. Oral contrast is, however, seen throughout the colon. No evidence of free  intraperitoneal air. IMPRESSION: 1. Persistently dilated gas-filled loops of small bowel throughout the central abdomen, compatible with the small-bowel obstruction demonstrated on CT of 03/26/2017. 2. Oral contrast has reached the colon indicating partial small bowel obstruction. Electronically Signed   By: Franki Cabot M.D.   On: 03/27/2017 11:32    Medications / Allergies: per chart  Antibiotics: Anti-infectives (From admission, onward)   Start     Dose/Rate Route Frequency Ordered Stop   03/28/17 0800  Ampicillin-Sulbactam (UNASYN) 3 g in sodium chloride 0.9 % 100 mL IVPB     3 g 200 mL/hr over 30 Minutes Intravenous Every 6 hours 03/28/17 0722     03/28/17 0730  cefTRIAXone (ROCEPHIN) 2 g in dextrose 5 % 50 mL IVPB  Status:  Discontinued    Comments:  Pharmacy may adjust dosing strength / duration / interval for maximal efficacy   2 g 100 mL/hr over 30 Minutes Intravenous Every 24 hours 03/28/17 0720 03/28/17 6484        Note: Portions of this report may have been transcribed using voice recognition software. Every effort was made to ensure accuracy; however, inadvertent computerized transcription errors may be present.   Any transcriptional errors that result from this process are unintentional.     Adin Hector, M.D., F.A.C.S. Gastrointestinal and Minimally Invasive Surgery [facility] [address] [place], [place] [address] [phone] Main / Paging   03/28/2017

## 2017-03-28 NOTE — Progress Notes (Signed)
Note: Portions of this report may have been transcribed using voice recognition software. Every effort was made to ensure accuracy; however, inadvertent computerized transcription errors may be present.   Any transcriptional errors that result from this process are unintentional.             Adam Mcdowell  12-02-1995 [id number]  Patient Care Team: Cindra PresumeShugart, Christine M as PCP - General (Family Medicine)  Xrays concerning for persistent SBO - reattempt SBO protocol w NGT  Patient Active Problem List   Diagnosis Date Noted  . Noncompliance by refusing intervention or support 03/27/2017  . SBO (small bowel obstruction) ([facility]) 03/26/2017  . Abscesses of abdominal cavity from complex perforated appendicitis 03/26/2017  . Tobacco abuse 03/26/2017  . Small bowel obstruction ([facility]) 03/26/2017  . Acute appendicitis with rupture 01/28/2017    History reviewed. No pertinent past medical history.  Past Surgical History:  Procedure Laterality Date  . IR RADIOLOGIST EVAL & MGMT  02/21/2017    Social History   Socioeconomic History  . Marital status: Single    Spouse name: Not on file  . Number of children: Not on file  . Years of education: Not on file  . Highest education level: Not on file  Social Needs  . Financial resource strain: Not on file  . Food insecurity - worry: Not on file  . Food insecurity - inability: Not on file  . Transportation needs - medical: Not on file  . Transportation needs - non-medical: Not on file  Occupational History  . Not on file  Tobacco Use  . Smoking status: Current Every Day Smoker    Types: E-cigarettes  . Smokeless tobacco: Never Used  Substance and Sexual Activity  . Alcohol use: No  . Drug use: No  . Sexual activity: Not on file  Other Topics Concern  . Not on file  Social History Narrative  . Not on file    Family History  Problem Relation Age of Onset  . Hypertension Mother     Current Facility-Administered Medications   Medication Dose Route Frequency Provider Last Rate Last Dose  . acetaminophen (TYLENOL) tablet 650 mg  650 mg Oral Q6H PRN Jerre SimonFocht, Jessica L, PA   650 mg at 03/28/17 0217   Or  . acetaminophen (TYLENOL) suppository 650 mg  650 mg Rectal Q6H PRN Focht, Jessica L, PA      . alum & mag hydroxide-simeth (MAALOX/MYLANTA) 200-200-20 MG/5ML suspension 30 mL  30 mL Oral Q6H PRN Karie SodaGross, Lakelyn Straus, MD      . Ampicillin-Sulbactam (UNASYN) 3 g in sodium chloride 0.9 % 100 mL IVPB  3 g Intravenous Trecia RogersQ6H Vladislav Axelson, MD   Stopped at 03/28/17 [phone]  . bisacodyl (DULCOLAX) suppository 10 mg  10 mg Rectal Daily Karie SodaGross, Christe Tellez, MD   Stopped at 03/26/17 2141  . chlorproMAZINE (THORAZINE) 25 mg in sodium chloride 0.9 % 25 mL IVPB  25 mg Intravenous Q6H PRN Absher, Ky Barbanandall K, RPH      . diatrizoate meglumine-sodium (GASTROGRAFIN) 66-10 % solution 90 mL  90 mL Per NG tube Once Karie SodaGross, Annalynne Ibanez, MD      . diphenhydrAMINE (BENADRYL) capsule 25 mg  25 mg Oral Q6H PRN Focht, Jessica L, PA       Or  . diphenhydrAMINE (BENADRYL) injection 25 mg  25 mg Intravenous Q6H PRN Focht, Jessica L, PA      . diphenhydrAMINE (BENADRYL) injection 12.5-25 mg  12.5-25 mg Intravenous Q6H PRN Karie SodaGross, Amylia Collazos, MD      .  enoxaparin (LOVENOX) injection 40 mg  40 mg Subcutaneous QHS Focht, Jessica L, PA   40 mg at 03/27/17 2204  . guaiFENesin-dextromethorphan (ROBITUSSIN DM) 100-10 MG/5ML syrup 10 mL  10 mL Oral Q4H PRN Karie SodaGross, Sagan Wurzel, MD      . hydrocortisone (ANUSOL-HC) 2.5 % rectal cream 1 application  1 application Topical QID PRN Karie SodaGross, Kemyah Buser, MD      . hydrocortisone cream 1 % 1 application  1 application Topical TID PRN Karie SodaGross, Tarrance Januszewski, MD      . lactated ringers bolus 1,000 mL  1,000 mL Intravenous Q8H PRN Amerigo Mcglory, Viviann SpareSteven, MD      . lactated ringers infusion   Intravenous Continuous Jerre SimonFocht, Jessica L, PA 100 mL/hr at 03/28/17 0704    . lip balm (CARMEX) ointment 1 application  1 application Topical BID Karie SodaGross, Kaitelyn Jamison, MD   1 application at 03/27/17  2204  . magic mouthwash  15 mL Oral QID PRN Karie SodaGross, Maghan Jessee, MD      . menthol-cetylpyridinium (CEPACOL) lozenge 3 mg  1 lozenge Oral PRN Karie SodaGross, Pradyun Ishman, MD      . methocarbamol (ROBAXIN) 1,000 mg in dextrose 5 % 50 mL IVPB  1,000 mg Intravenous Q6H PRN Karie SodaGross, Dishon Kehoe, MD      . metoCLOPramide (REGLAN) injection 5-10 mg  5-10 mg Intravenous Q6H PRN Absher, Ky Barbanandall K, RPH      . metoprolol tartrate (LOPRESSOR) injection 5 mg  5 mg Intravenous Q6H PRN Karie SodaGross, Anayiah Howden, MD   5 mg at 03/27/17 1427  . morphine 4 MG/ML injection 2-4 mg  2-4 mg Intravenous Q2H PRN Focht, Jessica L, PA   4 mg at 03/28/17 1139  . ondansetron (ZOFRAN) injection 4 mg  4 mg Intravenous Q6H PRN Karie SodaGross, Tanecia Mccay, MD   4 mg at 03/28/17 [id number]   Or  . ondansetron (ZOFRAN) 8 mg in sodium chloride 0.9 % 50 mL IVPB  8 mg Intravenous Q6H PRN Karie SodaGross, Mischelle Reeg, MD      . ondansetron (ZOFRAN-ODT) disintegrating tablet 4 mg  4 mg Oral Q6H PRN Focht, Jessica L, PA      . phenol (CHLORASEPTIC) mouth spray 1-2 spray  1-2 spray Mouth/Throat PRN Karie SodaGross, Ethel Meisenheimer, MD      . prochlorperazine (COMPAZINE) injection 5-10 mg  5-10 mg Intravenous Q4H PRN Karie SodaGross, Jaydin Jalomo, MD   10 mg at 03/28/17 [id number]     No Known Allergies  BP 132/70 (BP Location: Left Arm)   Pulse (!) 122   Temp (!) 103.2 F (39.6 C) (Oral)   Resp 18   Ht 5\' 5"  (1.651 m)   Wt 79 kg (174 lb 2.6 oz)   SpO2 97%   BMI 28.98 kg/m   Ct Abdomen Pelvis W Contrast  Result Date: 03/26/2017 CLINICAL DATA:  Epigastric pain starting yesterday. Bloating. Nausea and vomiting. Appendiceal rupture in early September and scheduled to have appendectomy 12/13. History of drains for appendiceal abscess. EXAM: CT ABDOMEN AND PELVIS WITH CONTRAST TECHNIQUE: Multidetector CT imaging of the abdomen and pelvis was performed using the standard protocol following bolus administration of intravenous contrast. CONTRAST:  100mL ISOVUE-300 IOPAMIDOL (ISOVUE-300) INJECTION 61%, 30mL ISOVUE-300 IOPAMIDOL (ISOVUE-300)  INJECTION 61% COMPARISON:  02/21/2017 FINDINGS: Lower chest: Posterior right middle lobe and central right lower lobe patchy airspace disease on image 1/series 4. Normal heart size without pericardial or pleural effusion. Hepatobiliary: Normal liver. Normal gallbladder, without biliary ductal dilatation. Pancreas: Normal, without mass or ductal dilatation. Spleen: Normal in size, without focal abnormality. Adrenals/Urinary Tract: Normal adrenal glands. Normal  kidneys, without hydronephrosis. Normal urinary bladder. Stomach/Bowel: Normal stomach, without wall thickening. Relatively decompressed distal colon. Normal appendiceal base with probable residual appendiceal tip distention, including at 11 millimeters on image 62/series 2. Normal caliber of proximal small bowel loops. Mid small bowel loops are dilated, many in a circular position. Although there is no specific evidence of complicating ischemia, there is small volume interloop fluid in this area. A transition is identified on images 51-41. Vascular/Lymphatic: Normal caliber of the aorta and branch vessels. No abdominopelvic adenopathy. Reproductive: Normal prostate. Other: Small volume cul-de-sac fluid and right abdominal ascites are likely secondary. No pneumatosis or free intraperitoneal air. Musculoskeletal: No acute osseous abnormality. IMPRESSION: 1. Findings most consistent with mid small bowel obstruction. Given clinical history, most likely related to adhesions from prior perforated appendicitis. Given morphology and position of bowel loops, a closed loop obstruction cannot be excluded. Recommend clinical surveillance and ongoing plain film follow-up to confirm resolution. 2. No evidence of residual abscess. 3. Further improvement in appearance of the appendix. 4. Patchy right base airspace disease is new. Especially if the patient has had recent vomiting, aspiration is suspected. Electronically Signed   By: Jeronimo Greaves M.D.   On: 03/26/2017 14:09    Dg Chest Port 1 View  Result Date: 03/27/2017 CLINICAL DATA:  SBO protocol, per pt some new recent cough, no other chest complaints EXAM: PORTABLE CHEST 1 VIEW COMPARISON:  None. FINDINGS: Platelike opacity within the right lower lung. Left lung is clear. No pleural effusion or pneumothorax seen. Heart size is upper normal. Osseous structures about the chest are unremarkable. IMPRESSION: Small platelike opacity at the right lung base, atelectasis versus pneumonia. Electronically Signed   By: Bary Richard M.D.   On: 03/27/2017 11:33   Dg Abd Acute W/chest  Result Date: 03/28/2017 CLINICAL DATA:  Mid to upper abdominal pain. Follow-up small bowel obstruction post appendectomy. EXAM: DG ABDOMEN ACUTE W/ 1V CHEST COMPARISON:  03/27/2017 and CT 03/26/2017 FINDINGS: Lungs are somewhat hypoinflated with mild interval worsening opacification over the right mid to lower lung which may be due to atelectasis or infection. Mild stable cardiomegaly. Remainder of the chest is unchanged. Abdominopelvic images demonstrate persistent air-filled dilated small bowel loops in the central abdomen measuring up to 5.3 cm in diameter without significant change. Air and contrast are present throughout the colon. No evidence of free peritoneal air. A few air-fluid levels are present. Remainder of the exam is unchanged. IMPRESSION: Persistent air-filled dilated small bowel loops in the central abdomen without significant change compatible with early/partial small bowel obstruction. Slight worsening opacification over the right base which may be due to atelectasis or infection. Stable cardiomegaly. Electronically Signed   By: Elberta Fortis M.D.   On: 03/28/2017 11:38   Dg Abd Portable 1v-small Bowel Obstruction Protocol-initial, 8 Hr Delay  Result Date: 03/27/2017 CLINICAL DATA:  8hr delay small bowel obstruction protocol, still some abd discomfort, distention EXAM: PORTABLE ABDOMEN - 1 VIEW COMPARISON:  CT abdomen dated  03/26/2017 FINDINGS: Persistent dilated gas-filled loops of small bowel throughout the central abdomen. Oral contrast is, however, seen throughout the colon. No evidence of free intraperitoneal air. IMPRESSION: 1. Persistently dilated gas-filled loops of small bowel throughout the central abdomen, compatible with the small-bowel obstruction demonstrated on CT of 03/26/2017. 2. Oral contrast has reached the colon indicating partial small bowel obstruction. Electronically Signed   By: Bary Richard M.D.   On: 03/27/2017 11:32

## 2017-03-29 ENCOUNTER — Inpatient Hospital Stay (HOSPITAL_COMMUNITY): Payer: BLUE CROSS/BLUE SHIELD

## 2017-03-29 LAB — COMPREHENSIVE METABOLIC PANEL
ALT: 13 U/L — ABNORMAL LOW (ref 17–63)
ANION GAP: 9 (ref 5–15)
AST: 12 U/L — ABNORMAL LOW (ref 15–41)
Albumin: 3.3 g/dL — ABNORMAL LOW (ref 3.5–5.0)
Alkaline Phosphatase: 55 U/L (ref 38–126)
BUN: 8 mg/dL (ref 6–20)
CHLORIDE: 103 mmol/L (ref 101–111)
CO2: 27 mmol/L (ref 22–32)
Calcium: 8.5 mg/dL — ABNORMAL LOW (ref 8.9–10.3)
Creatinine, Ser: 0.95 mg/dL (ref 0.61–1.24)
Glucose, Bld: 95 mg/dL (ref 65–99)
Potassium: 3.5 mmol/L (ref 3.5–5.1)
Sodium: 139 mmol/L (ref 135–145)
Total Bilirubin: 1 mg/dL (ref 0.3–1.2)
Total Protein: 6.5 g/dL (ref 6.5–8.1)

## 2017-03-29 LAB — CBC
HEMATOCRIT: 31.2 % — AB (ref 39.0–52.0)
HEMOGLOBIN: 10.5 g/dL — AB (ref 13.0–17.0)
MCH: 27.9 pg (ref 26.0–34.0)
MCHC: 33.7 g/dL (ref 30.0–36.0)
MCV: 83 fL (ref 78.0–100.0)
PLATELETS: 242 10*3/uL (ref 150–400)
RBC: 3.76 MIL/uL — AB (ref 4.22–5.81)
RDW: 12.8 % (ref 11.5–15.5)
WBC: 5.9 10*3/uL (ref 4.0–10.5)

## 2017-03-29 MED ORDER — HYDROMORPHONE HCL 1 MG/ML IJ SOLN: 0.5000 mg | INTRAMUSCULAR | Status: DC | PRN

## 2017-03-29 MED ORDER — METHOCARBAMOL 1000 MG/10ML IJ SOLN
500.0000 mg | Freq: Three times a day (TID) | INTRAVENOUS | Status: DC
  Filled 2017-03-29 (×2): qty 5

## 2017-03-29 MED ORDER — METHOCARBAMOL 1000 MG/10ML IJ SOLN
1000.0000 mg | Freq: Three times a day (TID) | INTRAVENOUS | Status: DC
  Administered 2017-03-29: 1000 mg via INTRAVENOUS
  Filled 2017-03-29 (×2): qty 10

## 2017-03-29 MED ORDER — DEXTROSE 5 % IV SOLN
1000.0000 mg | Freq: Four times a day (QID) | INTRAVENOUS | Status: DC
  Administered 2017-03-29 – 2017-04-01 (×11): 1000 mg via INTRAVENOUS
  Filled 2017-03-29 (×14): qty 10

## 2017-03-29 MED ORDER — KETOROLAC TROMETHAMINE 15 MG/ML IJ SOLN
15.0000 mg | Freq: Three times a day (TID) | INTRAMUSCULAR | Status: AC
  Administered 2017-03-29 – 2017-04-01 (×9): 15 mg via INTRAVENOUS
  Filled 2017-03-29 (×9): qty 1

## 2017-03-29 NOTE — Progress Notes (Signed)
[facility] Progress Note     Subjective: CC: muscle aches and pains Patient feels stiff all over, taking morphine for discomfort. Denies abdominal pain currently, still having a little nausea. Feels like he may have passed a little flatus. Can hear bowel sounds.  UOP good. Fever improving  Objective: Vital signs in last 24 hours: Temp:  [99.2 F (37.3 C)-103 F (39.4 C)] 99.2 F (37.3 C) (11/09 0519) Pulse Rate:  [107-124] 107 (11/09 0519) Resp:  [18-20] 18 (11/09 0519) BP: (132-141)/(74-89) 132/83 (11/09 0519) SpO2:  [94 %-96 %] 94 % (11/09 0519) Last BM Date: 03/28/17  Intake/Output from previous day: 11/08 0701 - 11/09 0700 In: 3670 [I.V.:3370; IV Piggyback:300] Out: 300 [Urine:300] Intake/Output this shift: No intake/output data recorded.  PE: Gen:  Alert, NAD, pleasant Card:  Regular rate and rhythm, pedal pulses 2+ BL Pulm:  Normal effort, clear to auscultation bilaterally Abd: Soft, non-tender, non-distended, bowel sounds present in all 4 quadrants, no HSM, no rebound, mild guarding Skin: warm and dry, no rashes  Psych: A&Ox3   Lab Results:  Recent Labs    03/28/17 1026 03/29/17 0722  WBC 5.2 5.9  HGB 10.5* 10.5*  HCT 31.4* 31.2*  PLT 243 242   BMET Recent Labs    03/27/17 0421 03/28/17 1026  NA 140 135  K 4.1 3.7  CL 105 101  CO2 28 25  GLUCOSE 105* 96  BUN 12 10  CREATININE 1.04 1.11  CALCIUM 9.2 8.1*   PT/INR No results for input(s): LABPROT, INR in the last 72 hours. CMP     Component Value Date/Time   NA 135 03/28/2017 1026   K 3.7 03/28/2017 1026   CL 101 03/28/2017 1026   CO2 25 03/28/2017 1026   GLUCOSE 96 03/28/2017 1026   BUN 10 03/28/2017 1026   CREATININE 1.11 03/28/2017 1026   CALCIUM 8.1 (L) 03/28/2017 1026   PROT 8.3 (H) 03/26/2017 1126   ALBUMIN 4.6 03/26/2017 1126   AST 23 03/26/2017 1126   ALT 31 03/26/2017 1126   ALKPHOS 103 03/26/2017 1126   BILITOT 0.7 03/26/2017 1126   GFRNONAA >60 03/28/2017  1026   GFRAA >60 03/28/2017 1026   Lipase     Component Value Date/Time   LIPASE 22 03/26/2017 1126     Studies/Results: Dg Chest Port 1 View  Result Date: 03/27/2017 CLINICAL DATA:  SBO protocol, per pt some new recent cough, no other chest complaints EXAM: PORTABLE CHEST 1 VIEW COMPARISON:  None. FINDINGS: Platelike opacity within the right lower lung. Left lung is clear. No pleural effusion or pneumothorax seen. Heart size is upper normal. Osseous structures about the chest are unremarkable. IMPRESSION: Small platelike opacity at the right lung base, atelectasis versus pneumonia. Electronically Signed   By: Bary Richard M.D.   On: 03/27/2017 11:33   Dg Abd Acute W/chest  Result Date: 03/28/2017 CLINICAL DATA:  Mid to upper abdominal pain. Follow-up small bowel obstruction post appendectomy. EXAM: DG ABDOMEN ACUTE W/ 1V CHEST COMPARISON:  03/27/2017 and CT 03/26/2017 FINDINGS: Lungs are somewhat hypoinflated with mild interval worsening opacification over the right mid to lower lung which may be due to atelectasis or infection. Mild stable cardiomegaly. Remainder of the chest is unchanged. Abdominopelvic images demonstrate persistent air-filled dilated small bowel loops in the central abdomen measuring up to 5.3 cm in diameter without significant change. Air and contrast are present throughout the colon. No evidence of free peritoneal air. A few air-fluid levels are present. Remainder  of the exam is unchanged. IMPRESSION: Persistent air-filled dilated small bowel loops in the central abdomen without significant change compatible with early/partial small bowel obstruction. Slight worsening opacification over the right base which may be due to atelectasis or infection. Stable cardiomegaly. Electronically Signed   By: Elberta Fortisaniel  Boyle M.D.   On: 03/28/2017 11:38   Dg Abd Portable 1v-small Bowel Obstruction Protocol-initial, 8 Hr Delay  Result Date: 03/29/2017 CLINICAL DATA:  8 hour small-bowel  protocol delayed film. EXAM: PORTABLE ABDOMEN - 1 VIEW COMPARISON:  03/28/2017 FINDINGS: Contrast material is demonstrated throughout the colon. No significant residual small-bowel contrast. Changes suggest no evidence of complete obstruction. Dilated mid abdominal small bowel are again demonstrated. Enteric tube tip is in the right upper quadrant consistent with location in the distal stomach. IMPRESSION: Contrast material throughout the colon without significant residual small bowel contrast material. Appearance suggest no evidence of complete obstruction. Electronically Signed   By: Burman NievesWilliam  Stevens M.D.   On: 03/29/2017 01:49   Dg Abd Portable 1v-small Bowel Protocol-position Verification  Result Date: 03/28/2017 CLINICAL DATA:  Patient is undergone NG tube placement. Small bowel protocol. EXAM: PORTABLE ABDOMEN - 1 VIEW COMPARISON:  Abdominal radiograph of March 28, 2017 FINDINGS: The nasogastric tube is been placed with the proximal port in the gastric cardia and the tip in the midbody. There is dilation of small bowel loops in the mid abdomen. There is contrast present in normal caliber ascending and descending colon from the previous CT scan of 6 November. IMPRESSION: Good positioning of the esophagogastric tube. Bowel gas pattern consistent with a relatively high-grade small bowel obstruction. Electronically Signed   By: David  SwazilandJordan M.D.   On: 03/28/2017 16:03   Dg Abd Portable 1v-small Bowel Obstruction Protocol-initial, 8 Hr Delay  Result Date: 03/27/2017 CLINICAL DATA:  8hr delay small bowel obstruction protocol, still some abd discomfort, distention EXAM: PORTABLE ABDOMEN - 1 VIEW COMPARISON:  CT abdomen dated 03/26/2017 FINDINGS: Persistent dilated gas-filled loops of small bowel throughout the central abdomen. Oral contrast is, however, seen throughout the colon. No evidence of free intraperitoneal air. IMPRESSION: 1. Persistently dilated gas-filled loops of small bowel throughout the  central abdomen, compatible with the small-bowel obstruction demonstrated on CT of 03/26/2017. 2. Oral contrast has reached the colon indicating partial small bowel obstruction. Electronically Signed   By: Bary RichardStan  Maynard M.D.   On: 03/27/2017 11:32    Anti-infectives: Anti-infectives (From admission, onward)   Start     Dose/Rate Route Frequency Ordered Stop   03/28/17 0800  Ampicillin-Sulbactam (UNASYN) 3 g in sodium chloride 0.9 % 100 mL IVPB     3 g 200 mL/hr over 30 Minutes Intravenous Every 6 hours 03/28/17 0722     03/28/17 0730  cefTRIAXone (ROCEPHIN) 2 g in dextrose 5 % 50 mL IVPB  Status:  Discontinued    Comments:  Pharmacy may adjust dosing strength / duration / interval for maximal efficacy   2 g 100 mL/hr over 30 Minutes Intravenous Every 24 hours 03/28/17 0720 03/28/17 [id number]       Assessment/Plan Perforated appendicitis treated with drains and abx in early September Scheduled for interval appendectomy 05/02/17 with Dr. Daphine DeutscherMartin  SBO -Likely 2/2adhesions from previous perforated appendicitis and drain placement -NGT placed yesterday, 600 cc output - clamp trials - contrast in colon on film this AM - mobilize  Fever - Tmax 103, afebrile this AM - WBC 5.9, C. Diff negative - imaging suggestive of Right aspiration PNA - patient started on unasyn  yesterday - sputum cx ordered - IS Muscle aches/stiffness - will order robaxin to help with this and encourage to cut down morphine usage  FEN: NPO, IVF; NGT clamp trials VTE: SCDs, lovenox ID: unasyn (11/8>>)  Plan: if patient tolerates clamping trials, may be able to try some clears this afternoon. Continue abx. IS, mobilize.     LOS: 3 days    Wells GuilesKelly Rayburn , [facility] 03/29/2017, 8:13 AM Pager: [phone] Consults: [phone] Mon-Fri 7:00 am-4:30 pm Sat-Sun 7:00 am-11:30 am

## 2017-03-30 ENCOUNTER — Inpatient Hospital Stay (HOSPITAL_COMMUNITY): Payer: BLUE CROSS/BLUE SHIELD

## 2017-03-30 LAB — CBC
HEMATOCRIT: 30.4 % — AB (ref 39.0–52.0)
HEMOGLOBIN: 9.9 g/dL — AB (ref 13.0–17.0)
MCH: 27.6 pg (ref 26.0–34.0)
MCHC: 32.6 g/dL (ref 30.0–36.0)
MCV: 84.7 fL (ref 78.0–100.0)
Platelets: 263 10*3/uL (ref 150–400)
RBC: 3.59 MIL/uL — ABNORMAL LOW (ref 4.22–5.81)
RDW: 13.2 % (ref 11.5–15.5)
WBC: 7 10*3/uL (ref 4.0–10.5)

## 2017-03-30 LAB — BASIC METABOLIC PANEL
Anion gap: 9 (ref 5–15)
BUN: 8 mg/dL (ref 6–20)
CALCIUM: 8.6 mg/dL — AB (ref 8.9–10.3)
CHLORIDE: 104 mmol/L (ref 101–111)
CO2: 26 mmol/L (ref 22–32)
CREATININE: 0.91 mg/dL (ref 0.61–1.24)
GFR calc Af Amer: >60 mL/min (ref >60)
GFR calc non Af Amer: >60 mL/min (ref >60)
Glucose, Bld: 97 mg/dL (ref 65–99)
Potassium: 3.2 mmol/L — ABNORMAL LOW (ref 3.5–5.1)
Sodium: 139 mmol/L (ref 135–145)

## 2017-03-30 LAB — MAGNESIUM: MAGNESIUM: 1.9 mg/dL (ref 1.7–2.4)

## 2017-03-30 MED ORDER — POTASSIUM CHLORIDE 10 MEQ/100ML IV SOLN
10.0000 meq | INTRAVENOUS | Status: AC
Start: 2017-03-30 — End: 2017-03-30
  Administered 2017-03-30 (×4): 10 meq via INTRAVENOUS
  Filled 2017-03-30 (×4): qty 100

## 2017-03-30 NOTE — Progress Notes (Signed)
[facility]  [address] [place], [place] [address] Phone: [phone]  FAX: [phone]      Justo Hengel [id number] Apr 23, 1996  CARE TEAM:  PCP: [name]  Outpatient Care Team: Patient Care Team: [name], [name] as PCP - General (Family Medicine)  Inpatient Treatment Team: Treatment Team: Attending Provider: Nolon Nations, MD; Rounding Team: [name], Md, MD; Registered Nurse: [name], RN; Registered Nurse: Clearence Ped, RN   Problem List:   Principal Problem:   SBO (small bowel obstruction) [facility]) Active Problems:   Acute appendicitis with rupture   Abscesses of abdominal cavity from complex perforated appendicitis   Tobacco abuse   Small bowel obstruction ([place])   Noncompliance by refusing intervention or support      * No surgery found *     Assessment  Bowel obstruction resolving.  Clinically improving.  Plan:  -I removed the nasogastric tube.  Had some coughing and irritation but feeling better without -Advance to pured diet.  May be solids if better. -Continue antibiotics for presumed aspiration pneumonia. -VTE prophylaxis- SCDs, etc -mobilize as tolerated to help recovery  20 minutes spent in review, evaluation, examination, counseling, and coordination of care.  More than 50% of that time was spent in counseling.  I updated the patient's status to the patient.  Recommendations were made.  Questions were answered.  He expressed understanding & appreciation.   Adin Hector, M.D., F.A.C.S. Gastrointestinal and Minimally Invasive Surgery [facility] [address] [place], [place] [address] [phone] Main / Paging   03/30/2017    Subjective: (Chief complaint)  Tolerating clears with NG tube clamped.  Moving around better.  Less abdominal pain.  Having gas and bowel movements.  Feeling better aside from irritation of the tube in  his nose  Objective:  Vital signs:  Vitals:   03/29/17 0519 03/29/17 1448 03/29/17 2039 03/30/17 0425  BP: 132/83 116/70 135/69 129/70  Pulse: (!) 107 87 82 86  Resp: 18 18 18 18   Temp: 99.2 F (37.3 C) (!) 100.4 F (38 C) 99.8 F (37.7 C) 98.1 F (36.7 C)  TempSrc: Oral Oral Oral Oral  SpO2: 94% 96% 98% 97%  Weight:      Height:        Last BM Date: 03/29/17  Intake/Output   Yesterday:  11/09 0701 - 11/10 0700 In: 2452.1 [I.V.:1932.1; IV Piggyback:520] Out: 1300 [Urine:700; Emesis/NG output:600] This shift:  No intake/output data recorded.  Bowel function:  Flatus: YES  BM:  YES  Drain: Bilious -thin from nasogastric tube.   Physical Exam:  General: Pt awake/alert/oriented x4 in no acute distress.  Sitting in chair looking at phone.  Walking around normally. Eyes: PERRL, normal EOM.  Sclera clear.  No icterus Neuro: CN II-XII intact w/o focal sensory/motor deficits. Lymph: No head/neck/groin lymphadenopathy Psych:  No delerium/psychosis/paranoia.  Still with mild affect, avoiding eyes. HENT: Normocephalic, Mucus membranes moist.  No thrush Neck: Supple, No tracheal deviation Chest: No chest wall pain w good excursion CV:  Pulses intact.  Regular rhythm MS: Normal AROM mjr joints.  No obvious deformity  Abdomen: Soft.  Mildy distended.  Nontender.  No evidence of peritonitis.  No incarcerated hernias.  Ext:   No deformity.  No mjr edema.  No cyanosis Skin: No petechiae / purpura  Results:   Labs: Results for orders placed or performed during the hospital encounter of 03/26/17 (from the past 48 hour(s))  CBC with Differential/Platelet     Status: Abnormal   Collection Time: 03/28/17 10:26 AM  Result Value Ref Range   WBC 5.2 4.0 - 10.5 K/uL   RBC 3.75 (L) 4.22 - 5.81 MIL/uL   Hemoglobin 10.5 (L) 13.0 - 17.0 g/dL   HCT 31.4 (L) 39.0 - 52.0 %   MCV 83.7 78.0 - 100.0 fL   MCH 28.0 26.0 - 34.0 pg   MCHC 33.4 30.0 - 36.0 g/dL   RDW 12.8 11.5 - 15.5 %    Platelets 243 150 - 400 K/uL   Neutrophils Relative % 70 %   Neutro Abs 3.6 1.7 - 7.7 K/uL   Lymphocytes Relative 17 %   Lymphs Abs 0.9 0.7 - 4.0 K/uL   Monocytes Relative 13 %   Monocytes Absolute 0.7 0.1 - 1.0 K/uL   Eosinophils Relative 0 %   Eosinophils Absolute 0.0 0.0 - 0.7 K/uL   Basophils Relative 0 %   Basophils Absolute 0.0 0.0 - 0.1 K/uL  Basic metabolic panel     Status: Abnormal   Collection Time: 03/28/17 10:26 AM  Result Value Ref Range   Sodium 135 135 - 145 mmol/L   Potassium 3.7 3.5 - 5.1 mmol/L   Chloride 101 101 - 111 mmol/L   CO2 25 22 - 32 mmol/L   Glucose, Bld 96 65 - 99 mg/dL   BUN 10 6 - 20 mg/dL   Creatinine, Ser 1.11 0.61 - 1.24 mg/dL   Calcium 8.1 (L) 8.9 - 10.3 mg/dL   GFR calc non Af Amer >60 >60 mL/min   GFR calc Af Amer >60 >60 mL/min    Comment: (NOTE) The eGFR has been calculated using the CKD EPI equation. This calculation has not been validated in all clinical situations. eGFR's persistently <60 mL/min signify possible Chronic Kidney Disease.    Anion gap 9 5 - 15  C difficile quick scan w PCR reflex     Status: None   Collection Time: 03/28/17  1:15 PM  Result Value Ref Range   C Diff antigen NEGATIVE NEGATIVE   C Diff toxin NEGATIVE NEGATIVE   C Diff interpretation No C. difficile detected.   Comprehensive metabolic panel     Status: Abnormal   Collection Time: 03/29/17  7:22 AM  Result Value Ref Range   Sodium 139 135 - 145 mmol/L   Potassium 3.5 3.5 - 5.1 mmol/L   Chloride 103 101 - 111 mmol/L   CO2 27 22 - 32 mmol/L   Glucose, Bld 95 65 - 99 mg/dL   BUN 8 6 - 20 mg/dL   Creatinine, Ser 0.95 0.61 - 1.24 mg/dL   Calcium 8.5 (L) 8.9 - 10.3 mg/dL   Total Protein 6.5 6.5 - 8.1 g/dL   Albumin 3.3 (L) 3.5 - 5.0 g/dL   AST 12 (L) 15 - 41 U/L   ALT 13 (L) 17 - 63 U/L   Alkaline Phosphatase 55 38 - 126 U/L   Total Bilirubin 1.0 0.3 - 1.2 mg/dL   GFR calc non Af Amer >60 >60 mL/min   GFR calc Af Amer >60 >60 mL/min    Comment:  (NOTE) The eGFR has been calculated using the CKD EPI equation. This calculation has not been validated in all clinical situations. eGFR's persistently <60 mL/min signify possible Chronic Kidney Disease.    Anion gap 9 5 - 15  CBC     Status: Abnormal   Collection Time: 03/29/17  7:22 AM  Result  Value Ref Range   WBC 5.9 4.0 - 10.5 K/uL   RBC 3.76 (L) 4.22 - 5.81 MIL/uL   Hemoglobin 10.5 (L) 13.0 - 17.0 g/dL   HCT 31.2 (L) 39.0 - 52.0 %   MCV 83.0 78.0 - 100.0 fL   MCH 27.9 26.0 - 34.0 pg   MCHC 33.7 30.0 - 36.0 g/dL   RDW 12.8 11.5 - 15.5 %   Platelets 242 150 - 400 K/uL  Basic metabolic panel     Status: Abnormal   Collection Time: 03/30/17  4:32 AM  Result Value Ref Range   Sodium 139 135 - 145 mmol/L   Potassium 3.2 (L) 3.5 - 5.1 mmol/L   Chloride 104 101 - 111 mmol/L   CO2 26 22 - 32 mmol/L   Glucose, Bld 97 65 - 99 mg/dL   BUN 8 6 - 20 mg/dL   Creatinine, Ser 0.91 0.61 - 1.24 mg/dL   Calcium 8.6 (L) 8.9 - 10.3 mg/dL   GFR calc non Af Amer >60 >60 mL/min   GFR calc Af Amer >60 >60 mL/min    Comment: (NOTE) The eGFR has been calculated using the CKD EPI equation. This calculation has not been validated in all clinical situations. eGFR's persistently <60 mL/min signify possible Chronic Kidney Disease.    Anion gap 9 5 - 15    Imaging / Studies: Dg Abd Acute W/chest  Result Date: 03/28/2017 CLINICAL DATA:  Mid to upper abdominal pain. Follow-up small bowel obstruction post appendectomy. EXAM: DG ABDOMEN ACUTE W/ 1V CHEST COMPARISON:  03/27/2017 and CT 03/26/2017 FINDINGS: Lungs are somewhat hypoinflated with mild interval worsening opacification over the right mid to lower lung which may be due to atelectasis or infection. Mild stable cardiomegaly. Remainder of the chest is unchanged. Abdominopelvic images demonstrate persistent air-filled dilated small bowel loops in the central abdomen measuring up to 5.3 cm in diameter without significant change. Air and contrast are  present throughout the colon. No evidence of free peritoneal air. A few air-fluid levels are present. Remainder of the exam is unchanged. IMPRESSION: Persistent air-filled dilated small bowel loops in the central abdomen without significant change compatible with early/partial small bowel obstruction. Slight worsening opacification over the right base which may be due to atelectasis or infection. Stable cardiomegaly. Electronically Signed   By: Marin Olp M.D.   On: 03/28/2017 11:38   Dg Abd Portable 1v  Result Date: 03/30/2017 CLINICAL DATA:  Small-bowel obstruction EXAM: PORTABLE ABDOMEN - 1 VIEW COMPARISON:  03/29/2017 FINDINGS: NG tube remains in the gastric antrum. Nearly all of the contrast in the colon seen on the prior study is passed. No dilated large or small bowel loops are identified. No bowel edema. IMPRESSION: Resolution of small bowel dilatation. Interval passing of contrast in the colon. Electronically Signed   By: Franchot Gallo M.D.   On: 03/30/2017 06:33   Dg Abd Portable 1v-small Bowel Obstruction Protocol-initial, 8 Hr Delay  Result Date: 03/29/2017 CLINICAL DATA:  8 hour small-bowel protocol delayed film. EXAM: PORTABLE ABDOMEN - 1 VIEW COMPARISON:  03/28/2017 FINDINGS: Contrast material is demonstrated throughout the colon. No significant residual small-bowel contrast. Changes suggest no evidence of complete obstruction. Dilated mid abdominal small bowel are again demonstrated. Enteric tube tip is in the right upper quadrant consistent with location in the distal stomach. IMPRESSION: Contrast material throughout the colon without significant residual small bowel contrast material. Appearance suggest no evidence of complete obstruction. Electronically Signed   By: Oren Beckmann.D.  On: 03/29/2017 01:49   Dg Abd Portable 1v-small Bowel Protocol-position Verification  Result Date: 03/28/2017 CLINICAL DATA:  Patient is undergone NG tube placement. Small bowel protocol. EXAM:  PORTABLE ABDOMEN - 1 VIEW COMPARISON:  Abdominal radiograph of March 28, 2017 FINDINGS: The nasogastric tube is been placed with the proximal port in the gastric cardia and the tip in the midbody. There is dilation of small bowel loops in the mid abdomen. There is contrast present in normal caliber ascending and descending colon from the previous CT scan of 6 November. IMPRESSION: Good positioning of the esophagogastric tube. Bowel gas pattern consistent with a relatively high-grade small bowel obstruction. Electronically Signed   By: David  Martinique M.D.   On: 03/28/2017 16:03    Medications / Allergies: per chart  Antibiotics: Anti-infectives (From admission, onward)   Start     Dose/Rate Route Frequency Ordered Stop   03/28/17 0800  Ampicillin-Sulbactam (UNASYN) 3 g in sodium chloride 0.9 % 100 mL IVPB     3 g 200 mL/hr over 30 Minutes Intravenous Every 6 hours 03/28/17 0722     03/28/17 0730  cefTRIAXone (ROCEPHIN) 2 g in dextrose 5 % 50 mL IVPB  Status:  Discontinued    Comments:  Pharmacy may adjust dosing strength / duration / interval for maximal efficacy   2 g 100 mL/hr over 30 Minutes Intravenous Every 24 hours 03/28/17 0720 03/28/17 0109        Note: Portions of this report may have been transcribed using voice recognition software. Every effort was made to ensure accuracy; however, inadvertent computerized transcription errors may be present.   Any transcriptional errors that result from this process are unintentional.     Adin Hector, M.D., F.A.C.S. Gastrointestinal and Minimally Invasive Surgery [facility] [address] [place],  [place] [address] [phone] Main / Paging   03/30/2017

## 2017-03-31 MED ORDER — PSYLLIUM 95 % PO PACK
1.0000 | PACK | Freq: Every day | ORAL | Status: DC
  Administered 2017-03-31: 1 via ORAL
  Filled 2017-03-31: qty 1

## 2017-03-31 NOTE — Progress Notes (Signed)
[name] [place], [place] [address] Phone: [phone]  FAX: [phone]      Adam Mcdowell [id number] Feb 06, 1996  CARE TEAM:  PCP: [name]  Outpatient Care Team: Patient Care Team: [name], [name] as PCP - General (Family Medicine)  Inpatient Treatment Team: Treatment Team: Attending Provider: Nolon Nations, MD; Rounding Team: [name], Md, MD; Registered Nurse: [name], RN; Registered Nurse: Clearence Ped, RN; Registered Nurse: Cheryle Horsfall, RN; Technician: Ileene Rubens, NT   Problem List:   Principal Problem:   SBO (small bowel obstruction) ([place]) Active Problems:   Acute appendicitis with rupture   Abscesses of abdominal cavity from complex perforated appendicitis   Tobacco abuse   Small bowel obstruction ([place])   Noncompliance by refusing intervention or support      * No surgery found *     Assessment  Bowel obstruction resolving.  Clinically improving.  Plan:  -Advance to solids. -Continue antibiotics for presumed aspiration pneumonia.  Unasyn day #3/7.  Switch to  PO Augmentin at d/c to complete course (d/w Dr. Florene Glen, [facility]) -VTE prophylaxis- SCDs, etc -mobilize as tolerated to help recovery  20 minutes spent in review, evaluation, examination, counseling, and coordination of care.  More than 50% of that time was spent in counseling.  I updated the patient's status to the patient.  Recommendations were made.  Questions were answered.  He expressed understanding & appreciation.   Adam Mcdowell, M.D., F.A.C.S. Gastrointestinal and Minimally Invasive Surgery [facility] [address] [place], [place] [address] [phone] Main / Paging   03/31/2017    Subjective: (Chief complaint)  Tolerating pureed Moving around better. Minimal abdominal pain - better Having gas and bowel movements.  Objective:  Vital  signs:  Vitals:   03/30/17 1500 03/30/17 1603 03/30/17 2028 03/31/17 0537  BP: 123/70  118/68 113/79  Pulse: 60  81 67  Resp: 18  20 20   Temp: 98.3 F (36.8 C)  98.1 F (36.7 C) 98.4 F (36.9 C)  TempSrc: Oral  Oral Oral  SpO2: 100% 98% 100% 98%  Weight:      Height:        Last BM Date: 03/30/17  Intake/Output   Yesterday:  11/10 0701 - 11/11 0700 In: 1668.8 [P.O.:480; I.V.:868.8; IV Piggyback:320] Out: -  This shift:  No intake/output data recorded.  Bowel function:  Flatus: YES  BM:  YES  Drain: no drain   Physical Exam:  General: Pt awake/alert/oriented x4 in no acute distress.  Sitting in chair looking at phone.  Walking around normally. Eyes: PERRL, normal EOM.  Sclera clear.  No icterus Neuro: CN II-XII intact w/o focal sensory/motor deficits. Lymph: No head/neck/groin lymphadenopathy Psych:  No delerium/psychosis/paranoia.  Still with mild affect, avoiding eyes. HENT: Normocephalic, Mucus membranes moist.  No thrush Neck: Supple, No tracheal deviation Chest: No chest wall pain w good excursion CV:  Pulses intact.  Regular rhythm MS: Normal AROM mjr joints.  No obvious deformity  Abdomen: Soft.  Mildy distended.  Nontender.  No evidence of peritonitis.  No incarcerated hernias.  Ext:   No deformity.  No mjr edema.  No cyanosis Skin: No petechiae / purpura  Results:   Labs: Results for orders placed or performed during the hospital encounter of 03/26/17 (from the past 48 hour(s))  Comprehensive metabolic panel     Status: Abnormal   Collection Time: 03/29/17  7:22 AM  Result Value Ref Range   Sodium 139 135 - 145 mmol/L   Potassium 3.5 3.5 - 5.1 mmol/L   Chloride 103 101 - 111 mmol/L   CO2 27 22 - 32 mmol/L   Glucose, Bld 95 65 - 99 mg/dL   BUN 8 6 - 20 mg/dL   Creatinine, Ser 0.95 0.61 - 1.24 mg/dL   Calcium 8.5 (L) 8.9 - 10.3 mg/dL   Total Protein 6.5 6.5 - 8.1 g/dL   Albumin 3.3 (L) 3.5 - 5.0 g/dL   AST 12 (L) 15 - 41 U/L   ALT 13 (L) 17  - 63 U/L   Alkaline Phosphatase 55 38 - 126 U/L   Total Bilirubin 1.0 0.3 - 1.2 mg/dL   GFR calc non Af Amer >60 >60 mL/min   GFR calc Af Amer >60 >60 mL/min    Comment: (NOTE) The eGFR has been calculated using the CKD EPI equation. This calculation has not been validated in all clinical situations. eGFR's persistently <60 mL/min signify possible Chronic Kidney Disease.    Anion gap 9 5 - 15  CBC     Status: Abnormal   Collection Time: 03/29/17  7:22 AM  Result Value Ref Range   WBC 5.9 4.0 - 10.5 K/uL   RBC 3.76 (L) 4.22 - 5.81 MIL/uL   Hemoglobin 10.5 (L) 13.0 - 17.0 g/dL   HCT 31.2 (L) 39.0 - 52.0 %   MCV 83.0 78.0 - 100.0 fL   MCH 27.9 26.0 - 34.0 pg   MCHC 33.7 30.0 - 36.0 g/dL   RDW 12.8 11.5 - 15.5 %   Platelets 242 150 - 400 K/uL  CBC     Status: Abnormal   Collection Time: 03/30/17  4:32 AM  Result Value Ref Range   WBC 7.0 4.0 - 10.5 K/uL   RBC 3.59 (L) 4.22 - 5.81 MIL/uL   Hemoglobin 9.9 (L) 13.0 - 17.0 g/dL   HCT 30.4 (L) 39.0 - 52.0 %   MCV 84.7 78.0 - 100.0 fL   MCH 27.6 26.0 - 34.0 pg   MCHC 32.6 30.0 - 36.0 g/dL   RDW 13.2 11.5 - 15.5 %   Platelets 263 150 - 400 K/uL  Basic metabolic panel     Status: Abnormal   Collection Time: 03/30/17  4:32 AM  Result Value Ref Range   Sodium 139 135 - 145 mmol/L   Potassium 3.2 (L) 3.5 - 5.1 mmol/L   Chloride 104 101 - 111 mmol/L   CO2 26 22 - 32 mmol/L   Glucose, Bld 97 65 - 99 mg/dL   BUN 8 6 - 20 mg/dL   Creatinine, Ser 0.91 0.61 - 1.24 mg/dL   Calcium 8.6 (L) 8.9 - 10.3 mg/dL   GFR calc non Af Amer >60 >60 mL/min   GFR calc Af Amer >60 >60 mL/min    Comment: (NOTE) The eGFR has been calculated using the CKD EPI equation. This calculation has not been validated in all clinical situations. eGFR's persistently <60 mL/min signify possible Chronic Kidney Disease.    Anion gap 9 5 - 15  Magnesium     Status: None   Collection Time: 03/30/17  4:32 AM  Result Value Ref Range   Magnesium 1.9 1.7 - 2.4 mg/dL     Imaging / Studies: Dg Abd Portable 1v  Result Date: 03/30/2017 CLINICAL DATA:  Small-bowel obstruction EXAM: PORTABLE ABDOMEN - 1 VIEW COMPARISON:  03/29/2017 FINDINGS: NG tube remains in the gastric antrum. Nearly  all of the contrast in the colon seen on the prior study is passed. No dilated large or small bowel loops are identified. No bowel edema. IMPRESSION: Resolution of small bowel dilatation. Interval passing of contrast in the colon. Electronically Signed   By: Franchot Gallo M.D.   On: 03/30/2017 06:33    Medications / Allergies: per chart  Antibiotics: Anti-infectives (From admission, onward)   Start     Dose/Rate Route Frequency Ordered Stop   03/28/17 0800  Ampicillin-Sulbactam (UNASYN) 3 g in sodium chloride 0.9 % 100 mL IVPB     3 g 200 mL/hr over 30 Minutes Intravenous Every 6 hours 03/28/17 0722     03/28/17 0730  cefTRIAXone (ROCEPHIN) 2 g in dextrose 5 % 50 mL IVPB  Status:  Discontinued    Comments:  Pharmacy may adjust dosing strength / duration / interval for maximal efficacy   2 g 100 mL/hr over 30 Minutes Intravenous Every 24 hours 03/28/17 0720 03/28/17 0539        Note: Portions of this report may have been transcribed using voice recognition software. Every effort was made to ensure accuracy; however, inadvertent computerized transcription errors may be present.   Any transcriptional errors that result from this process are unintentional.     Adam Mcdowell, M.D., F.A.C.S. Gastrointestinal and Minimally Invasive Surgery [facility] [address] [place], [place] [address] [phone] Main / Paging   03/31/2017

## 2017-04-01 MED ORDER — AMOXICILLIN-POT CLAVULANATE 875-125 MG PO TABS: 1.0000 | ORAL_TABLET | Freq: Two times a day (BID) | ORAL | 0 refills | Status: DC

## 2017-04-01 NOTE — Discharge Instructions (Signed)
Small Bowel Obstruction A small bowel obstruction means that something is blocking the small bowel. The small bowel is also called the small intestine. It is the long tube that connects the stomach to the colon. An obstruction will stop food and fluids from passing through the small bowel. Treatment depends on what is causing the problem and how bad the problem is. Follow these instructions at home:  Get a lot of rest.  Follow your diet as told by your doctor. You may need to: ? Only drink clear liquids until you start to get better. ? Avoid solid foods as told by your doctor.  Take over-the-counter and prescription medicines only as told by your doctor.  Keep all follow-up visits as told by your doctor. This is important. Contact a doctor if:  You have a fever.  You have chills. Get help right away if:  You have pain or cramps that get worse.  You throw up (vomit) blood.  You have a feeling of being sick to your stomach (nausea) that does not go away.  You cannot stop throwing up.  You cannot drink fluids.  You feel confused.  You feel dry or thirsty (dehydrated).  Your belly gets more bloated.  You feel weak or you pass out (faint). This information is not intended to replace advice given to you by your health care provider. Make sure you discuss any questions you have with your health care provider. Document Released: 06/14/2004 Document Revised: 01/02/2016 Document Reviewed: 07/01/2014 Elsevier Interactive Patient Education  2018 [employer]   Diet: for 2 weeks drink plenty of liquids, avoid spicy foods, fatty foods or uncooked fruits and vegetables.  After that, resume your previous diet

## 2017-04-01 NOTE — Discharge Summary (Signed)
Physician Discharge Summary  Patient ID: Adam Mcdowell MRN: [id number] DOB/AGE: Mar 08, 1996 21 y.o.  Admit date: 03/26/2017 Discharge date: 04/01/2017  Admission Diagnoses: SBO  Discharge Diagnoses:  Principal Problem:   SBO (small bowel obstruction) ([facility]) Active Problems:   Acute appendicitis with rupture   Abscesses of abdominal cavity from complex perforated appendicitis   Tobacco abuse   Noncompliance by refusing intervention or support   Discharged Condition: good  Hospital Course: Pt admitted for SBO after being treated nonoperatively for perforated appendicitis in Sept.  This resolved with conservative management.  He was also noted to have a possible aspiration pneumonia, which was treated with Unasyn/Augmentin.  Consults: None  Significant Diagnostic Studies: labs: cbc, bmet  Treatments: IV hydration and antibiotics: Unasyn  Discharge Exam: Blood pressure 118/70, pulse 78, temperature 98.2 F (36.8 C), temperature source Oral, resp. rate 20, height 5\' 5"  (1.651 m), weight 79 kg (174 lb 2.6 oz), SpO2 96 %. General appearance: alert and cooperative GI: normal findings: soft, nontender  Disposition: 01-Home or Self Care    Follow-up Information    [facility] [place] Schedule an appointment as soon as possible for a visit in 2 week(s).   Specialty:  General Surgery Why:  or as needed Contact information: [address] [place] [id number] [phone]          Signed: Vanita PandaHOMAS, Soni Kegel C. 04/01/2017, 8:25 AM

## 2017-04-01 NOTE — Progress Notes (Signed)
Patient discharged to home, all discharge medications and instructions reviewed and questions answered.   

## 2017-04-23 NOTE — Progress Notes (Signed)
Preop on 12/7.  Need orders in epic.   

## 2017-04-25 NOTE — Progress Notes (Signed)
PREOP ON 04-26-17 PLEASE PLACE ORDERS IN Epic .

## 2017-04-25 NOTE — Progress Notes (Signed)
ekg 03-27-17 epic  cxr 11-71-8 epic

## 2017-04-25 NOTE — Patient Instructions (Addendum)
Adam RainJonathan Mcdowell  04/25/2017   Your procedure is scheduled on: 05-02-17  [facility]  Entrance [place]  elevators to 3rd floor to  Short Stay Center at    0530 AM.    Call this number if you have problems the morning of surgery [phone]    Remember: ONLY 1 PERSON MAY GO WITH YOU TO SHORT STAY TO GET  READY MORNING OF YOUR SURGERY.  Do not eat food or drink liquids :After Midnight.     Take these medicines the morning of surgery with A SIP OF WATER: none                                You may not have any metal on your body including hair pins and              piercings  Do not wear jewelry,  lotions, powders or perfumes, deodorant                       Men may shave face and neck.   Do not bring valuables to the hospital. [facility] IS NOT             RESPONSIBLE   FOR VALUABLES.  Contacts, dentures or bridgework may not be worn into surgery.  Leave suitcase in the car. After surgery it may be brought to your room.     Driver father Engineer, maintenancegreg Mcdowell patient to bring cell number day of surgery             Please read over the following fact sheets you were given: _____________________________________________________________________          [facility] - Preparing [facility] Before surgery, you can play an important role.  Because skin is not sterile, your skin needs to be as free of germs as possible.  You can reduce the number of germs on your skin by washing with CHG (chlorahexidine gluconate) soap before surgery.  CHG is an antiseptic cleaner which kills germs and bonds with the skin to continue killing germs even after washing. Please DO NOT use if you have an allergy to CHG or antibacterial soaps.  If your skin becomes reddened/irritated stop using the CHG and inform your nurse when you arrive at Short Stay. Do not shave (including legs and underarms) for at least 48 hours prior to the first CHG shower.  You may shave your  face/neck. Please follow these instructions carefully:  1.  Shower with CHG Soap the night before surgery and the  morning of Surgery.  2.  If you choose to wash your hair, wash your hair first as usual with your  normal  shampoo.  3.  After you shampoo, rinse your hair and body thoroughly to remove the  shampoo.                           4.  Use CHG as you would any other liquid soap.  You can apply chg directly  to the skin and wash                       Gently with a scrungie or clean washcloth.  5.  Apply the CHG Soap to your body ONLY FROM  THE NECK DOWN.   Do not use on face/ open                           Wound or open sores. Avoid contact with eyes, ears mouth and genitals (private parts).                       Wash face,  Genitals (private parts) with your normal soap.             6.  Wash thoroughly, paying special attention to the area where your surgery  will be performed.  7.  Thoroughly rinse your body with warm water from the neck down.  8.  DO NOT shower/wash with your normal soap after using and rinsing off  the CHG Soap.                9.  Pat yourself dry with a clean towel.            10.  Wear clean pajamas.            11.  Place clean sheets on your bed the night of your first shower and do not  sleep with pets. Day of Surgery : Do not apply any lotions/deodorants the morning of surgery.  Please wear clean clothes to the hospital/surgery center.  FAILURE TO FOLLOW THESE INSTRUCTIONS MAY RESULT IN THE CANCELLATION OF YOUR SURGERY PATIENT SIGNATURE_________________________________  NURSE SIGNATURE__________________________________  ________________________________________________________________________

## 2017-04-26 ENCOUNTER — Encounter (HOSPITAL_COMMUNITY): Payer: Self-pay

## 2017-04-26 ENCOUNTER — Other Ambulatory Visit: Payer: Self-pay

## 2017-04-26 ENCOUNTER — Encounter (HOSPITAL_COMMUNITY)
Admission: RE | Admit: 2017-04-26 | Discharge: 2017-04-26 | Disposition: A | Discharge status: Home / Self Care | Payer: BLUE CROSS/BLUE SHIELD | Source: Ambulatory Visit | Attending: Surgery | Admitting: Surgery

## 2017-04-26 DIAGNOSIS — Z01818 Encounter for other preprocedural examination: Secondary | ICD-10-CM | POA: Diagnosis not present

## 2017-04-26 DIAGNOSIS — K3532 Acute appendicitis with perforation and localized peritonitis, without abscess: Secondary | ICD-10-CM | POA: Insufficient documentation

## 2017-04-26 HISTORY — DX: Acute appendicitis with perforation, localized peritonitis, and gangrene, without abscess: K35.32

## 2017-04-26 HISTORY — DX: Unspecified intestinal obstruction, unspecified as to partial versus complete obstruction: K56.609

## 2017-04-26 LAB — CBC
HEMATOCRIT: 38.5 % — AB (ref 39.0–52.0)
HEMOGLOBIN: 12.9 g/dL — AB (ref 13.0–17.0)
MCH: 28 pg (ref 26.0–34.0)
MCHC: 33.5 g/dL (ref 30.0–36.0)
MCV: 83.5 fL (ref 78.0–100.0)
Platelets: 321 10*3/uL (ref 150–400)
RBC: 4.61 MIL/uL (ref 4.22–5.81)
RDW: 13.9 % (ref 11.5–15.5)
WBC: 7.7 10*3/uL (ref 4.0–10.5)

## 2017-04-26 NOTE — Progress Notes (Signed)
Spoke with dr Acey Lavcarignan anesthesia and made aware 03-27-17 chest xray results, do not need to repeat chest xray results per dr Acey Lavcarignan

## 2017-05-02 ENCOUNTER — Other Ambulatory Visit: Payer: Self-pay

## 2017-05-02 ENCOUNTER — Encounter (HOSPITAL_COMMUNITY): Admission: RE | Disposition: A | Discharge status: Home / Self Care | Payer: Self-pay | Source: Ambulatory Visit

## 2017-05-02 ENCOUNTER — Other Ambulatory Visit: Payer: Self-pay | Admitting: Surgery

## 2017-05-02 ENCOUNTER — Encounter (HOSPITAL_COMMUNITY): Payer: Self-pay | Admitting: *Deleted

## 2017-05-02 ENCOUNTER — Ambulatory Visit (HOSPITAL_COMMUNITY): Payer: BLUE CROSS/BLUE SHIELD | Admitting: Anesthesiology

## 2017-05-02 ENCOUNTER — Ambulatory Visit: Payer: Self-pay | Admitting: Surgery

## 2017-05-02 ENCOUNTER — Observation Stay (HOSPITAL_COMMUNITY)
Admission: RE | Admit: 2017-05-02 | Discharge: 2017-05-03 | Disposition: A | Discharge status: Home / Self Care | Payer: BLUE CROSS/BLUE SHIELD | Source: Ambulatory Visit | Attending: Surgery | Admitting: Surgery

## 2017-05-02 DIAGNOSIS — Z8719 Personal history of other diseases of the digestive system: Secondary | ICD-10-CM | POA: Diagnosis present

## 2017-05-02 DIAGNOSIS — K3580 Unspecified acute appendicitis: Secondary | ICD-10-CM | POA: Diagnosis not present

## 2017-05-02 DIAGNOSIS — Z9049 Acquired absence of other specified parts of digestive tract: Secondary | ICD-10-CM

## 2017-05-02 HISTORY — PX: LAPAROSCOPIC APPENDECTOMY: SHX408

## 2017-05-02 LAB — CBC
HEMATOCRIT: 37.2 % — AB (ref 39.0–52.0)
Hemoglobin: 12.1 g/dL — ABNORMAL LOW (ref 13.0–17.0)
MCH: 27.1 pg (ref 26.0–34.0)
MCHC: 32.5 g/dL (ref 30.0–36.0)
MCV: 83.4 fL (ref 78.0–100.0)
PLATELETS: 320 10*3/uL (ref 150–400)
RBC: 4.46 MIL/uL (ref 4.22–5.81)
RDW: 13.4 % (ref 11.5–15.5)
WBC: 12.2 10*3/uL — ABNORMAL HIGH (ref 4.0–10.5)

## 2017-05-02 LAB — CREATININE, SERUM
Creatinine, Ser: 0.94 mg/dL (ref 0.61–1.24)
GFR calc Af Amer: >60 mL/min (ref >60)
GFR calc non Af Amer: >60 mL/min (ref >60)

## 2017-05-02 SURGERY — APPENDECTOMY, LAPAROSCOPIC
Anesthesia: General

## 2017-05-02 MED ORDER — MIDAZOLAM HCL 2 MG/2ML IJ SOLN
INTRAMUSCULAR | Status: AC
  Filled 2017-05-02: qty 2

## 2017-05-02 MED ORDER — CEFOTETAN DISODIUM-DEXTROSE 2-2.08 GM-%(50ML) IV SOLR
INTRAVENOUS | Status: AC
  Administered 2017-05-02: 2 g
  Filled 2017-05-02: qty 50

## 2017-05-02 MED ORDER — ONDANSETRON 4 MG PO TBDP: 4.0000 mg | ORAL_TABLET | Freq: Four times a day (QID) | ORAL | Status: DC | PRN

## 2017-05-02 MED ORDER — HYDROMORPHONE HCL 1 MG/ML IJ SOLN: 0.2500 mg | INTRAMUSCULAR | Status: DC | PRN

## 2017-05-02 MED ORDER — LACTATED RINGERS IV SOLN
INTRAVENOUS | Status: DC | PRN
  Administered 2017-05-02 (×2): via INTRAVENOUS

## 2017-05-02 MED ORDER — FENTANYL CITRATE (PF) 100 MCG/2ML IJ SOLN
INTRAMUSCULAR | Status: DC | PRN
  Administered 2017-05-02: 50 ug via INTRAVENOUS
  Administered 2017-05-02: 100 ug via INTRAVENOUS
  Administered 2017-05-02 (×2): 50 ug via INTRAVENOUS

## 2017-05-02 MED ORDER — ROCURONIUM BROMIDE 100 MG/10ML IV SOLN
INTRAVENOUS | Status: DC | PRN
  Administered 2017-05-02: 10 mg via INTRAVENOUS
  Administered 2017-05-02: 25 mg via INTRAVENOUS
  Administered 2017-05-02 (×2): 10 mg via INTRAVENOUS

## 2017-05-02 MED ORDER — ONDANSETRON HCL 4 MG/2ML IJ SOLN
INTRAMUSCULAR | Status: DC | PRN
  Administered 2017-05-02: 4 mg via INTRAVENOUS

## 2017-05-02 MED ORDER — LIDOCAINE 2% (20 MG/ML) 5 ML SYRINGE
INTRAMUSCULAR | Status: AC
  Filled 2017-05-02: qty 5

## 2017-05-02 MED ORDER — SUGAMMADEX SODIUM 200 MG/2ML IV SOLN
INTRAVENOUS | Status: AC
  Filled 2017-05-02: qty 2

## 2017-05-02 MED ORDER — MORPHINE SULFATE (PF) 2 MG/ML IV SOLN
1.0000 mg | INTRAVENOUS | Status: DC | PRN
  Filled 2017-05-02: qty 1

## 2017-05-02 MED ORDER — DEXTROSE 5 % IV SOLN
INTRAVENOUS | Status: DC | PRN
  Administered 2017-05-02: 2 g via INTRAVENOUS

## 2017-05-02 MED ORDER — KCL IN DEXTROSE-NACL 20-5-0.45 MEQ/L-%-% IV SOLN
INTRAVENOUS | Status: DC
  Administered 2017-05-02 – 2017-05-03 (×2): via INTRAVENOUS
  Filled 2017-05-02 (×2): qty 1000

## 2017-05-02 MED ORDER — SUGAMMADEX SODIUM 200 MG/2ML IV SOLN
INTRAVENOUS | Status: DC | PRN
  Administered 2017-05-02: 200 mg via INTRAVENOUS

## 2017-05-02 MED ORDER — FENTANYL CITRATE (PF) 100 MCG/2ML IJ SOLN
25.0000 ug | INTRAMUSCULAR | Status: DC | PRN
  Administered 2017-05-02 (×2): 50 ug via INTRAVENOUS

## 2017-05-02 MED ORDER — SUCCINYLCHOLINE CHLORIDE 20 MG/ML IJ SOLN
INTRAMUSCULAR | Status: DC | PRN
  Administered 2017-05-02: 100 mg via INTRAVENOUS

## 2017-05-02 MED ORDER — LACTATED RINGERS IR SOLN
Status: DC | PRN
  Administered 2017-05-02: 1000 mL

## 2017-05-02 MED ORDER — DEXAMETHASONE SODIUM PHOSPHATE 10 MG/ML IJ SOLN
INTRAMUSCULAR | Status: AC
  Filled 2017-05-02: qty 1

## 2017-05-02 MED ORDER — HYDROCODONE-ACETAMINOPHEN 5-325 MG PO TABS
ORAL_TABLET | ORAL | Status: AC
  Filled 2017-05-02: qty 1

## 2017-05-02 MED ORDER — ONDANSETRON HCL 4 MG/2ML IJ SOLN
4.0000 mg | Freq: Four times a day (QID) | INTRAMUSCULAR | Status: DC | PRN
  Filled 2017-05-02: qty 2

## 2017-05-02 MED ORDER — FENTANYL CITRATE (PF) 100 MCG/2ML IJ SOLN
INTRAMUSCULAR | Status: AC
  Filled 2017-05-02: qty 2

## 2017-05-02 MED ORDER — BUPIVACAINE LIPOSOME 1.3 % IJ SUSP
INTRAMUSCULAR | Status: DC | PRN
  Administered 2017-05-02: 20 mL

## 2017-05-02 MED ORDER — ROCURONIUM BROMIDE 50 MG/5ML IV SOSY
PREFILLED_SYRINGE | INTRAVENOUS | Status: AC
  Filled 2017-05-02: qty 5

## 2017-05-02 MED ORDER — DEXAMETHASONE SODIUM PHOSPHATE 10 MG/ML IJ SOLN
INTRAMUSCULAR | Status: DC | PRN
  Administered 2017-05-02: 10 mg via INTRAVENOUS

## 2017-05-02 MED ORDER — PROPOFOL 10 MG/ML IV BOLUS
INTRAVENOUS | Status: AC
  Filled 2017-05-02: qty 20

## 2017-05-02 MED ORDER — PANTOPRAZOLE SODIUM 40 MG IV SOLR
40.0000 mg | Freq: Every day | INTRAVENOUS | Status: DC
  Administered 2017-05-02: 40 mg via INTRAVENOUS
  Filled 2017-05-02: qty 40

## 2017-05-02 MED ORDER — LIDOCAINE HCL (CARDIAC) 20 MG/ML IV SOLN
INTRAVENOUS | Status: DC | PRN
  Administered 2017-05-02: 100 mg via INTRAVENOUS

## 2017-05-02 MED ORDER — BUPIVACAINE LIPOSOME 1.3 % IJ SUSP
20.0000 mL | Freq: Once | INTRAMUSCULAR | Status: DC
  Filled 2017-05-02: qty 20

## 2017-05-02 MED ORDER — SODIUM CHLORIDE 0.9 % IR SOLN
Status: DC | PRN
  Administered 2017-05-02: 1000 mL

## 2017-05-02 MED ORDER — HEPARIN SODIUM (PORCINE) 5000 UNIT/ML IJ SOLN
5000.0000 [IU] | Freq: Three times a day (TID) | INTRAMUSCULAR | Status: DC
  Administered 2017-05-02 – 2017-05-03 (×2): 5000 [IU] via SUBCUTANEOUS
  Filled 2017-05-02 (×2): qty 1

## 2017-05-02 MED ORDER — ONDANSETRON HCL 4 MG/2ML IJ SOLN
INTRAMUSCULAR | Status: AC
  Filled 2017-05-02: qty 2

## 2017-05-02 MED ORDER — MIDAZOLAM HCL 5 MG/5ML IJ SOLN
INTRAMUSCULAR | Status: DC | PRN
  Administered 2017-05-02: 2 mg via INTRAVENOUS

## 2017-05-02 MED ORDER — PROPOFOL 10 MG/ML IV BOLUS
INTRAVENOUS | Status: DC | PRN
  Administered 2017-05-02: 200 mg via INTRAVENOUS

## 2017-05-02 MED ORDER — FENTANYL CITRATE (PF) 250 MCG/5ML IJ SOLN
INTRAMUSCULAR | Status: AC
  Filled 2017-05-02: qty 5

## 2017-05-02 MED ORDER — HYDROCODONE-ACETAMINOPHEN 5-325 MG PO TABS
1.0000 | ORAL_TABLET | ORAL | Status: DC | PRN
  Administered 2017-05-02 – 2017-05-03 (×3): 1 via ORAL
  Filled 2017-05-02 (×2): qty 1

## 2017-05-02 MED ORDER — DEXTROSE 5 % IV SOLN
2.0000 g | Freq: Two times a day (BID) | INTRAVENOUS | Status: AC
  Administered 2017-05-02: 2 g via INTRAVENOUS
  Filled 2017-05-02: qty 2

## 2017-05-02 SURGICAL SUPPLY — 36 items
APPLIER CLIP ROT 10 11.4 M/L (STAPLE)
CABLE HIGH FREQUENCY MONO STRZ (ELECTRODE) ×3 IMPLANT
CHLORAPREP W/TINT 26ML (MISCELLANEOUS) IMPLANT
CLIP APPLIE ROT 10 11.4 M/L (STAPLE) IMPLANT
COVER SURGICAL LIGHT HANDLE (MISCELLANEOUS) ×3 IMPLANT
CUTTER FLEX LINEAR 45M (STAPLE) ×3 IMPLANT
DECANTER SPIKE VIAL GLASS SM (MISCELLANEOUS) IMPLANT
DERMABOND ADVANCED (GAUZE/BANDAGES/DRESSINGS) ×2
DERMABOND ADVANCED .7 DNX12 (GAUZE/BANDAGES/DRESSINGS) ×1 IMPLANT
DRAPE LAPAROSCOPIC ABDOMINAL (DRAPES) ×3 IMPLANT
ELECT REM PT RETURN 15FT ADLT (MISCELLANEOUS) ×3 IMPLANT
ENDOLOOP SUT PDS II  0 18 (SUTURE)
ENDOLOOP SUT PDS II 0 18 (SUTURE) IMPLANT
GLOVE BIOGEL M 8.0 STRL (GLOVE) ×3 IMPLANT
GOWN STRL REUS W/TWL XL LVL3 (GOWN DISPOSABLE) ×3 IMPLANT
KIT BASIN OR (CUSTOM PROCEDURE TRAY) ×3 IMPLANT
PAD POSITIONING PINK XL (MISCELLANEOUS) ×3 IMPLANT
POUCH RETRIEVAL ECOSAC 10 (ENDOMECHANICALS) ×1 IMPLANT
POUCH RETRIEVAL ECOSAC 10MM (ENDOMECHANICALS) ×2
POUCH SPECIMEN RETRIEVAL 10MM (ENDOMECHANICALS) ×3 IMPLANT
RELOAD 45 VASCULAR/THIN (ENDOMECHANICALS) IMPLANT
RELOAD STAPLE TA45 3.5 REG BLU (ENDOMECHANICALS) IMPLANT
SCISSORS LAP 5X45 EPIX DISP (ENDOMECHANICALS) ×3 IMPLANT
SET IRRIG TUBING LAPAROSCOPIC (IRRIGATION / IRRIGATOR) ×3 IMPLANT
SHEARS HARMONIC ACE PLUS 45CM (MISCELLANEOUS) ×3 IMPLANT
SLEEVE XCEL OPT CAN 5 100 (ENDOMECHANICALS) ×3 IMPLANT
STAPLER VISISTAT 35W (STAPLE) IMPLANT
SUT MNCRL AB 4-0 PS2 18 (SUTURE) ×3 IMPLANT
TOWEL OR 17X26 10 PK STRL BLUE (TOWEL DISPOSABLE) ×3 IMPLANT
TRAY FOLEY W/METER SILVER 14FR (SET/KITS/TRAYS/PACK) IMPLANT
TRAY FOLEY W/METER SILVER 16FR (SET/KITS/TRAYS/PACK) ×3 IMPLANT
TRAY LAPAROSCOPIC (CUSTOM PROCEDURE TRAY) ×3 IMPLANT
TROCAR BLADELESS OPT 5 100 (ENDOMECHANICALS) ×3 IMPLANT
TROCAR XCEL BLUNT TIP 100MML (ENDOMECHANICALS) ×3 IMPLANT
TROCAR XCEL NON-BLD 11X100MML (ENDOMECHANICALS) IMPLANT
TUBING INSUF HEATED (TUBING) ×3 IMPLANT

## 2017-05-02 NOTE — Op Note (Addendum)
Surgeon: Wenda LowMatt Mohmed Farver, MD, FACS  Asst:  none  Anes:  general  Preop Dx: Hx of ruptured appendicitis with peritonitis-s/p drainage of abscesses in the right and left gutters Postop Dx: same  Procedure: Interval laparoscopic appendectomy with enterolysis Location Surgery: [facility] 4 Complications: none  EBL:   minimal cc  Drains: none  Description of Procedure:  The patient was taken to OR 4 .  After anesthesia was administered and the patient was prepped a timeout was performed.  Access achieved with a Hasson technique through the umbilicus.  I was surprised that the adhesions were localized to the right lower quadrant where the ruptured appendix had been in I was able to create a pneumoperitoneum without great difficulty.  Survey of the abdomen and took pictures of this which I subsequently gave to his parents.  He had some Glisson capsule adhesions holding the right lobe of the liver anteriorly and you could see his gallbladder and some other adhesions to his colon where he had this right-sided abscess.  In the terminal ileum there was a sling of small bowel that was tethered to an adhesion stuck to the anterior abdominal wall which I think was likely a source for his admission in November with a small bowel obstruction.  All these adhesions were lysed sharply and/or with the harmonic scalpel.  I then found found to cecum and went down to the base of the cecum to identify his beginning in origin of his appendix.  More distally and the appendix it appeared very woody and fixed to the anterior abdominal wall again consistent with the perforation.  I was able to get through the base and then applied the stapler with a vascular load and transect the appendix.  I went through the mesentery of the appendix with a harmonic scalpel and removed it in toto.  The adhesions to the terminal ileum were taken down sharply to free this area as well.  When completed I placed in an Eco- bag and brought it out through the  umbilicus.  The fascia was then closed with a figure-of-eight suture of 0 Vicryl.  Incisions were injected with Exparel and after the pneumoperitoneum was withdrawn and the fascial closure had actually been inspected laparoscopically and the abdomen deflated we closed the skin with 4-0 Monocryl and then Dermabond.          The patient tolerated the procedure well and was taken to the PACU in stable condition.     Adam B. Daphine DeutscherMartin, MD, [facility] [place] [phone]

## 2017-05-02 NOTE — Anesthesia Postprocedure Evaluation (Signed)
Anesthesia Post Note  Patient: Adam Mcdowell  Procedure(s) Performed: LAPAROSCOPIC  APPENDECTOMY, LYSIS OF ADHESIONS (N/A )     Patient location during evaluation: PACU Anesthesia Type: General Level of consciousness: awake Pain management: pain level controlled Vital Signs Assessment: post-procedure vital signs reviewed and stable Respiratory status: spontaneous breathing Cardiovascular status: stable Anesthetic complications: no    Last Vitals:  Vitals:   05/02/17 1000 05/02/17 1015  BP: 117/62   Pulse: 93 72  Resp: 17 16  Temp:    SpO2: 100% 100%    Last Pain:  Vitals:   05/02/17 1015  TempSrc:   PainSc: 4                  Jordie Skalsky

## 2017-05-02 NOTE — H&P (Signed)
Chief Complaint:  Prior ruptured appendicitis in September  History of Present Illness:  Adam Mcdowell is a89 year old otherwise healthy male who had acute appendicitis with perforation back in early September with multiple drain placement and plans for appendectomy on 05/02/17 with Dr. Daphine DeutscherMartin who presented to the emergency department with complaints of abdominal pain and one episode of vomiting. Patient states he wasn't feeling great on Sunday and had increased belching. Yesterday patient began having aggressively worsening abdominal pain and bloating with 1 episode of emesis today. Patient denies nausea. He states his abdominal pain is in the upper abdomen, constant, crampy in nature, sometimes severe, nonradiating. He took 6 Advil without relief. His father is at bedside. Patient had associated chills. He denies fever, blood in his vomit, diarrhea, chest pain, shortness of breath. His last normal bowel movement was yesterday. He still having a small amount flatus. Patient is a Archivistcollege student.  He returns for interval appendectomy    Past Medical History:  Diagnosis Date  . Ruptured appendicitis   . Small bowel obstruction ([facility]) 02/2017    Past Surgical History:  Procedure Laterality Date  . IR RADIOLOGIST EVAL & MGMT  02/21/2017    No current facility-administered medications for this encounter.    Patient has no known allergies. Family History  Problem Relation Age of Onset  . Hypertension Mother    Social History:   reports that  has never smoked. he has never used smokeless tobacco. He reports that he does not drink alcohol or use drugs.   REVIEW OF SYSTEMS : Negative except for see problem list  Physical Exam:   Blood pressure 123/80, pulse 74, temperature 97.8 F (36.6 C), temperature source Oral, resp. rate 16, height 5\' 5"  (1.651 m), weight 76.7 kg (169 lb), SpO2 100 %. Body mass index is 28.12 kg/m.  Gen:  WDWN WM NAD  Neurological: Alert and oriented to person,  place, and time. Motor and sensory function is grossly intact  Head: Normocephalic and atraumatic.  Eyes: Conjunctivae are normal. Pupils are equal, round, and reactive to light. No scleral icterus.  Neck: Normal range of motion. Neck supple. No tracheal deviation or thyromegaly present.  Cardiovascular:  SR without murmurs or gallops.  No carotid bruits Breast:  No masses Respiratory: Effort normal.  No respiratory distress. No chest wall tenderness. Breath sounds normal.  No wheezes, rales or rhonchi.  Abdomen:  Scars from prior percutaneous drain GU:  Not examined Musculoskeletal: Normal range of motion. Extremities are nontender. No cyanosis, edema or clubbing noted Lymphadenopathy: No cervical, preauricular, postauricular or axillary adenopathy is present Skin: Skin is warm and dry. No rash noted. No diaphoresis. No erythema. No pallor. Pscyh: Normal mood and affect. Behavior is normal. Judgment and thought content normal.   LABORATORY RESULTS: No results found for this or any previous visit (from the past 48 hour(s)).   RADIOLOGY RESULTS: No results found.  Problem List: Patient Active Problem List   Diagnosis Date Noted  . Noncompliance by refusing intervention or support 03/27/2017  . SBO (small bowel obstruction) ([facility]) 03/26/2017  . Abscesses of abdominal cavity from complex perforated appendicitis 03/26/2017  . Tobacco abuse 03/26/2017  . Acute appendicitis with rupture 01/28/2017    Assessment & Plan: History of ruptured appendicitis;  For interval appendectomy    Matt B. Daphine DeutscherMartin, MD, [facility]  [facility] [phone] [phone]  05/02/2017 6:37 AM

## 2017-05-02 NOTE — Transfer of Care (Signed)
Immediate Anesthesia Transfer of Care Note  Patient: Adam Mcdowell  Procedure(s) Performed: LAPAROSCOPIC  APPENDECTOMY, LYSIS OF ADHESIONS (N/A )  Patient Location: PACU  Anesthesia Type:General  Level of Consciousness: awake, alert  and oriented  Airway & Oxygen Therapy: Patient Spontanous Breathing and Patient connected to face mask oxygen  Post-op Assessment: Report given to RN and Post -op Vital signs reviewed and stable  Post vital signs: Reviewed and stable  Last Vitals:  Vitals:   05/02/17 0546  BP: 123/80  Pulse: 74  Resp: 16  Temp: 36.6 C  SpO2: 100%    Last Pain:  Vitals:   05/02/17 0546  TempSrc: Oral      Patients Stated Pain Goal: 4 (05/02/17 0546)  Complications: No apparent anesthesia complications

## 2017-05-02 NOTE — Anesthesia Preprocedure Evaluation (Addendum)
Anesthesia Evaluation  Patient identified by MRN, date of birth, ID band Patient awake    Reviewed: Allergy & Precautions, NPO status , Patient's Chart, lab work & pertinent test results  Airway Mallampati: II  TM Distance: >3 FB     Dental   Pulmonary    breath sounds clear to auscultation       Cardiovascular negative cardio ROS   Rhythm:Regular Rate:Normal     Neuro/Psych    GI/Hepatic Neg liver ROS,   Endo/Other  negative endocrine ROS  Renal/GU      Musculoskeletal   Abdominal   Peds  Hematology   Anesthesia Other Findings   Reproductive/Obstetrics                             Anesthesia Physical Anesthesia Plan  ASA: III  Anesthesia Plan: General   Post-op Pain Management:    Induction: Intravenous  PONV Risk Score and Plan: 2 and Treatment may vary due to age or medical condition, Dexamethasone, Ondansetron and Midazolam  Airway Management Planned: Oral ETT  Additional Equipment:   Intra-op Plan:   Post-operative Plan: Extubation in OR  Informed Consent: I have reviewed the patients History and Physical, chart, labs and discussed the procedure including the risks, benefits and alternatives for the proposed anesthesia with the patient or authorized representative who has indicated his/her understanding and acceptance.   Dental advisory given  Plan Discussed with: Anesthesiologist and CRNA  Anesthesia Plan Comments:         Anesthesia Quick Evaluation

## 2017-05-02 NOTE — Interval H&P Note (Signed)
History and Physical Interval Note:  05/02/2017 7:33 AM  Adam Mcdowell  has presented today for surgery, with the diagnosis of PRIOR RUPTURED APPENDICITIS  The various methods of treatment have been discussed with the patient and family. After consideration of risks, benefits and other options for treatment, the patient has consented to  Procedure(s): LAPAROSCOPIC INTERVAL APPENDECTOMY (N/A) as a surgical intervention .  The patient's history has been reviewed, patient examined, no change in status, stable for surgery.  I have reviewed the patient's chart and labs.  Questions were answered to the patient's satisfaction.     Valarie MerinoMatthew B Ekansh Sherk

## 2017-05-02 NOTE — Anesthesia Procedure Notes (Signed)
Procedure Name: Intubation Date/Time: 05/02/2017 7:46 AM Performed by: Thornell MuleStubblefield, Barbara Keng G, CRNA Pre-anesthesia Checklist: Patient identified, Emergency Drugs available, Suction available and Patient being monitored Patient Re-evaluated:Patient Re-evaluated prior to induction Oxygen Delivery Method: Circle system utilized Preoxygenation: Pre-oxygenation with 100% oxygen Induction Type: IV induction Ventilation: Mask ventilation without difficulty Laryngoscope Size: Miller and 3 Grade View: Grade I Tube type: Oral Tube size: 7.5 mm Number of attempts: 1 Airway Equipment and Method: Stylet and Oral airway Placement Confirmation: ETT inserted through vocal cords under direct vision,  positive ETCO2 and breath sounds checked- equal and bilateral Secured at: 22 cm Tube secured with: Tape Dental Injury: Teeth and Oropharynx as per pre-operative assessment

## 2017-05-03 ENCOUNTER — Encounter (HOSPITAL_COMMUNITY): Payer: Self-pay | Admitting: Surgery

## 2017-05-03 DIAGNOSIS — K3580 Unspecified acute appendicitis: Secondary | ICD-10-CM | POA: Diagnosis not present

## 2017-05-03 LAB — BASIC METABOLIC PANEL
Anion gap: 6 (ref 5–15)
BUN: 10 mg/dL (ref 6–20)
CHLORIDE: 108 mmol/L (ref 101–111)
CO2: 24 mmol/L (ref 22–32)
CREATININE: 0.98 mg/dL (ref 0.61–1.24)
Calcium: 9.1 mg/dL (ref 8.9–10.3)
GFR calc Af Amer: >60 mL/min (ref >60)
GFR calc non Af Amer: >60 mL/min (ref >60)
GLUCOSE: 155 mg/dL — AB (ref 65–99)
Potassium: 4 mmol/L (ref 3.5–5.1)
SODIUM: 138 mmol/L (ref 135–145)

## 2017-05-03 LAB — CBC
HEMATOCRIT: 34.9 % — AB (ref 39.0–52.0)
HEMOGLOBIN: 11.5 g/dL — AB (ref 13.0–17.0)
MCH: 27.6 pg (ref 26.0–34.0)
MCHC: 33 g/dL (ref 30.0–36.0)
MCV: 83.9 fL (ref 78.0–100.0)
Platelets: 340 10*3/uL (ref 150–400)
RBC: 4.16 MIL/uL — ABNORMAL LOW (ref 4.22–5.81)
RDW: 13.7 % (ref 11.5–15.5)
WBC: 16 10*3/uL — ABNORMAL HIGH (ref 4.0–10.5)

## 2017-05-03 MED ORDER — HYDROCODONE-ACETAMINOPHEN 5-325 MG PO TABS: 1.0000 | ORAL_TABLET | ORAL | 0 refills | Status: AC | PRN

## 2017-05-03 NOTE — Progress Notes (Signed)
Patient d/ced home with mother

## 2017-05-03 NOTE — Progress Notes (Signed)
[facility] Progress Note  1 Day Post-Op  Subjective: CC:  No complaints. Tolerating breakfast and walked in hallway this AM. Denies abdominal pain, has mild RLQ soreness.  Objective: Vital signs in last 24 hours: Temp:  [97.3 F (36.3 C)-98.6 F (37 C)] 98.4 F (36.9 C) (12/14 0523) Pulse Rate:  [56-101] 66 (12/14 0523) Resp:  [11-20] 18 (12/14 0523) BP: (100-132)/(46-96) 101/57 (12/14 0523) SpO2:  [98 %-100 %] 98 % (12/14 0523) Last BM Date: 04/30/17  Intake/Output from previous day: 12/13 0701 - 12/14 0700 In: 2513.8 [P.O.:360; I.V.:2103.8; IV Piggyback:50] Out: 2720 [Urine:2700; Blood:20] Intake/Output this shift: No intake/output data recorded.  PE: Gen:  Alert, NAD, pleasant Card:  Regular rate and rhythm Pulm:  Normal effort Abd: Soft, non-tender, non-distended, bowel sounds present, incisions C/D/I Skin: warm and dry, no rashes  Psych: A&Ox3    Lab Results:  Recent Labs    05/02/17 1458 05/03/17 0440  WBC 12.2* 16.0*  HGB 12.1* 11.5*  HCT 37.2* 34.9*  PLT 320 340   BMET Recent Labs    05/02/17 1458 05/03/17 0440  NA  --  138  K  --  4.0  CL  --  108  CO2  --  24  GLUCOSE  --  155*  BUN  --  10  CREATININE 0.94 0.98  CALCIUM  --  9.1   PT/INR No results for input(s): LABPROT, INR in the last 72 hours. CMP     Component Value Date/Time   NA 138 05/03/2017 0440   K 4.0 05/03/2017 0440   CL 108 05/03/2017 0440   CO2 24 05/03/2017 0440   GLUCOSE 155 (H) 05/03/2017 0440   BUN 10 05/03/2017 0440   CREATININE 0.98 05/03/2017 0440   CALCIUM 9.1 05/03/2017 0440   PROT 6.5 03/29/2017 0722   ALBUMIN 3.3 (L) 03/29/2017 0722   AST 12 (L) 03/29/2017 0722   ALT 13 (L) 03/29/2017 0722   ALKPHOS 55 03/29/2017 0722   BILITOT 1.0 03/29/2017 0722   GFRNONAA >60 05/03/2017 0440   GFRAA >60 05/03/2017 0440   Lipase     Component Value Date/Time   LIPASE 22 03/26/2017 1126       Studies/Results: No results  found.  Anti-infectives: Anti-infectives (From admission, onward)   Start     Dose/Rate Route Frequency Ordered Stop   05/02/17 2000  cefoTEtan (CEFOTAN) 2 g in dextrose 5 % 50 mL IVPB     2 g 100 mL/hr over 30 Minutes Intravenous Every 12 hours 05/02/17 1348 05/02/17 2203   05/02/17 0738  cefoTEtan in Dextrose 5% (CEFOTAN) 2-2.08 GM-%(50ML) IVPB    Comments:  Jarvis Newcomerrmistead, Lacey   : cabinet override      05/02/17 0738 05/02/17 2114     Assessment/Plan POD#1 S/P Interval laparoscopic appendectomy with enterolysis by Dr. Daphine DeutscherMartin  Tolerating PO, pain controlled, mobilizing, urinating independently and clinically stable for discharge.    LOS: 0 days    Adam PhenixElizabeth S Satvik Parco , [facility] 05/03/2017, 9:32 AM Pager: [phone] Consults: [phone] Mon-Fri 7:00 am-4:30 pm Sat-Sun 7:00 am-11:30 am

## 2017-05-03 NOTE — Discharge Instructions (Signed)
PAIN CONTROL:  - tylenol 500mg  every 6 hours as needed. Do not exceed 4,000 mg daily (be mindful that your prescription pain medication may contain tylenol) - ibuprofen 400 mg every 6 hours as needed.   please arrive at least 15 min before your appointment to complete your check in paperwork.  If you are unable to arrive 30 min prior to your appointment time we may have to cancel or reschedule you.  LAPAROSCOPIC SURGERY: POST OP INSTRUCTIONS  1. DIET: Follow a light bland diet the first 24 hours after arrival home, such as soup, liquids, crackers, etc. Be sure to include lots of fluids daily. Avoid fast food or heavy meals as your are more likely to get nauseated. Eat a low fat the next few days after surgery.  2. Take your usually prescribed home medications unless otherwise directed. 3. PAIN CONTROL:  1. Pain is best controlled by a usual combination of three different methods TOGETHER:  1. Ice/Heat 2. Over the counter pain medication 3. Prescription pain medication 2. Most patients will experience some swelling and bruising around the incisions. Ice packs or heating pads (30-60 minutes up to 6 times a day) will help. Use ice for the first few days to help decrease swelling and bruising, then switch to heat to help relax tight/sore spots and speed recovery. Some people prefer to use ice alone, heat alone, alternating between ice & heat. Experiment to what works for you. Swelling and bruising can take several weeks to resolve.  3. It is helpful to take an over-the-counter pain medication regularly for the first few weeks. Choose one of the following that works best for you:  1. Naproxen (Aleve, etc) Two 220mg  tabs twice a day 2. Ibuprofen (Advil, etc) Three 200mg  tabs four times a day (every meal & bedtime) 3. Acetaminophen (Tylenol, etc) 500-650mg  four times a day (every meal & bedtime) 4. A prescription for pain medication (such as oxycodone, hydrocodone, etc) should be given to you upon  discharge. Take your pain medication as prescribed.  1. If you are having problems/concerns with the prescription medicine (does not control pain, nausea, vomiting, rash, itching, etc), please call us [phone]) [phone] to see if we need to switch you to a different pain medicine that will work better for you and/or control your side effect better. 2. If you need a refill on your pain medication, please contact your pharmacy. They will contact our office to request authorization. Prescriptions will not be filled after 5 pm or on week-ends. 4. Avoid getting constipated. Between the surgery and the pain medications, it is common to experience some constipation. Increasing fluid intake and taking a fiber supplement (such as Metamucil, Citrucel, FiberCon, MiraLax, etc) 1-2 times a day regularly will usually help prevent this problem from occurring. A mild laxative (prune juice, Milk of Magnesia, MiraLax, etc) should be taken according to package directions if there are no bowel movements after 48 hours.  5. Watch out for diarrhea. If you have many loose bowel movements, simplify your diet to bland foods & liquids for a few days. Stop any stool softeners and decrease your fiber supplement. Switching to mild anti-diarrheal medications (Kayopectate, Pepto Bismol) can help. If this worsens or does not improve, please call us. 6. Wash / shower every day. You may shower over the dressings as they are waterproof. Continue to shower over incision(s) after the dressing is off. 7. Remove your waterproof bandages 5 days after surgery. You may leave the incision open to air.  You may replace a dressing/Band-Aid to cover the incision for comfort if you wish.  8. ACTIVITIES as tolerated:  1. You may resume regular (light) daily activities beginning the next day--such as daily self-care, walking, climbing stairs--gradually increasing activities as tolerated. If you can walk 30 minutes without difficulty, it is safe to try more  intense activity such as jogging, treadmill, bicycling, low-impact aerobics, swimming, etc. 2. Save the most intensive and strenuous activity for last such as sit-ups, heavy lifting, contact sports, etc Refrain from any heavy lifting or straining until you are off narcotics for pain control.  3. DO NOT PUSH THROUGH PAIN. Let pain be your guide: If it hurts to do something, don't do it. Pain is your body warning you to avoid that activity for another week until the pain goes down. 4. You may drive when you are no longer taking prescription pain medication, you can comfortably wear a seatbelt, and you can safely maneuver your car and apply brakes. 5. You may have sexual intercourse when it is comfortable.  9. FOLLOW UP in our office  1. Please call [facility] at [phone] to set up an appointment to see your surgeon in the office for a follow-up appointment approximately 2-3 weeks after your surgery. 2. Make sure that you call for this appointment the day you arrive home to insure a convenient appointment time.      10. IF YOU HAVE DISABILITY OR FAMILY LEAVE FORMS, BRING THEM TO THE               OFFICE FOR PROCESSING.   WHEN TO CALL US [phone]) [phone]:  1. Poor pain control 2. Reactions / problems with new medications (rash/itching, nausea, etc)  3. Fever over 101.5 F (38.5 C) 4. Inability to urinate 5. Nausea and/or vomiting 6. Worsening swelling or bruising 7. Continued bleeding from incision. 8. Increased pain, redness, or drainage from the incision  The clinic staff is available to answer your questions during regular business hours (8:30am-5pm). Please dont hesitate to call and ask to speak to one of our nurses for clinical concerns.  If you have a medical emergency, go to the nearest emergency room or call 911.  A surgeon from [facility] is always on call at the [facility]   [facility] [place]  [address], [address], [place], [place] [address] ?  MAIN:  ([phone]) [phone] ? TOLL FREE: [phone] ?  FAX [phone](336) [phone]  [email or web address]

## 2017-05-11 NOTE — Discharge Summary (Signed)
Physician Discharge Summary  Patient ID: Adam Mcdowell MRN: [id number] DOB/AGE: 24-Jun-1995 21 y.o.  Admit date: 05/02/2017 Discharge date: 05/03/2017  Admission Diagnoses:  Prior ruptured appendicitis with peritonitis  Discharge Diagnoses:  Same, post laparoscopic appendectomy  Principal Problem:   S/P laparoscopic appendectomyDec 2018 Active Problems:   Status post laparoscopic appendectomy   Surgery:  Laparoscopic appendectomy  Discharged Condition: improved  Hospital Course:   Had surgery and removal of prior ruptured appendix with some adhesiolysis.  He was kept overniight and did well and was ready to go home the next day.  Consults: none  Significant Diagnostic Studies: none    Discharge Exam: Blood pressure (!) 101/57, pulse 66, temperature 98.4 F (36.9 C), temperature source Oral, resp. rate 18, height 5\' 5"  (1.651 m), weight 76.7 kg (169 lb), SpO2 98 %. Incisions OK  Disposition: 01-Home or Self Care  Discharge Instructions    Discharge patient   Complete by:  As directed    Discharge disposition:  01-Home or Self Care   Discharge patient date:  05/03/2017     Allergies as of 05/03/2017   No Known Allergies     Medication List    STOP taking these medications   amoxicillin-clavulanate 875-125 MG tablet Commonly known as:  AUGMENTIN     TAKE these medications   HYDROcodone-acetaminophen 5-325 MG tablet Commonly known as:  NORCO/VICODIN Take 1 tablet by mouth every 4 (four) hours as needed for moderate pain.      Follow-up Information    Luretha MurphyMartin, Aristea Posada, MD. Schedule an appointment as soon as possible for a visit.   Specialty:  General Surgery Why:  2-3 weeks for post-operative follow up. Contact information: [address] [place] [address] [phone]           Signed: Valarie MerinoMatthew B Neal Oshea 05/11/2017, 6:14 AM

## 2019-07-06 IMAGING — RF DG SINUS / FISTULA TRACT / ABSCESSOGRAM
2 series · 5 of 5 positions shown · non-contrast
Comparison: CT abdomen and pelvis - 02/21/2017;

CLINICAL DATA: History of ruptured appendicitis, post placement of
2 percutaneous drainage catheters on 01/29/2017 and right trans
gluteal approach percutaneous drainage catheter placement on
02/01/2016 with subsequent fluoroscopic guided drainage catheter
exchange and repositioning on 02/05/2017.
TECHNIQUE: The patient was positioned supine on the fluoroscopy table.

[Series 1: one shot · 1 of 1 slices shown]
[im 1/1]
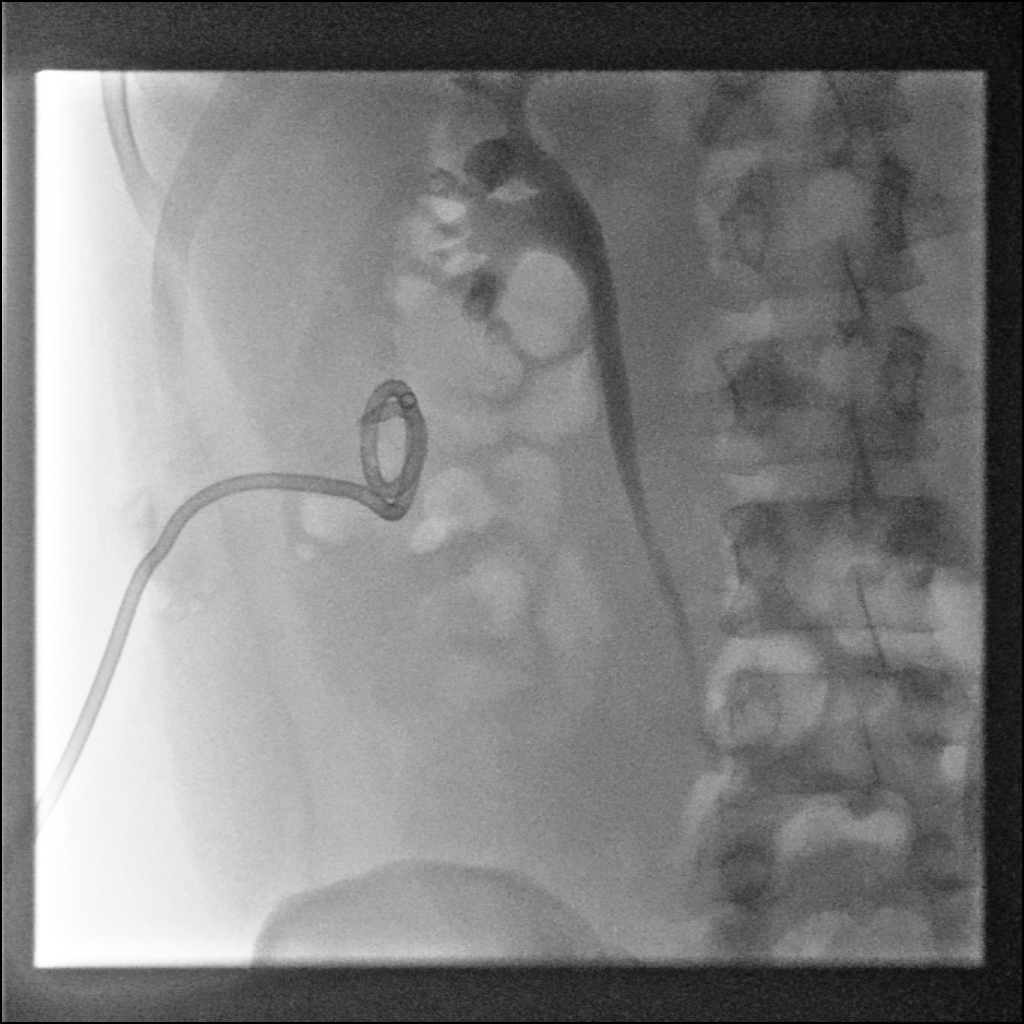

[Series 2: sequence · 4 of 32 frames shown]
[frame 5/32]
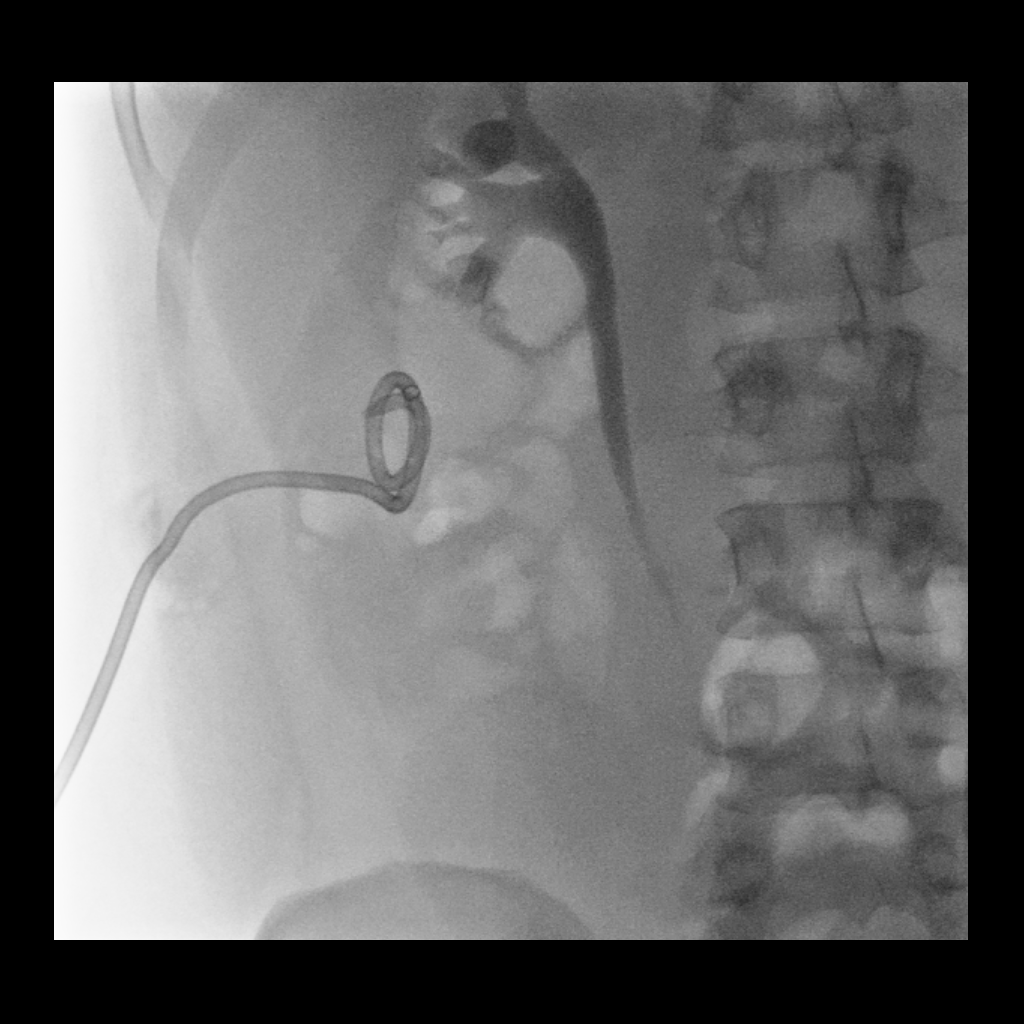
[frame 17/32]
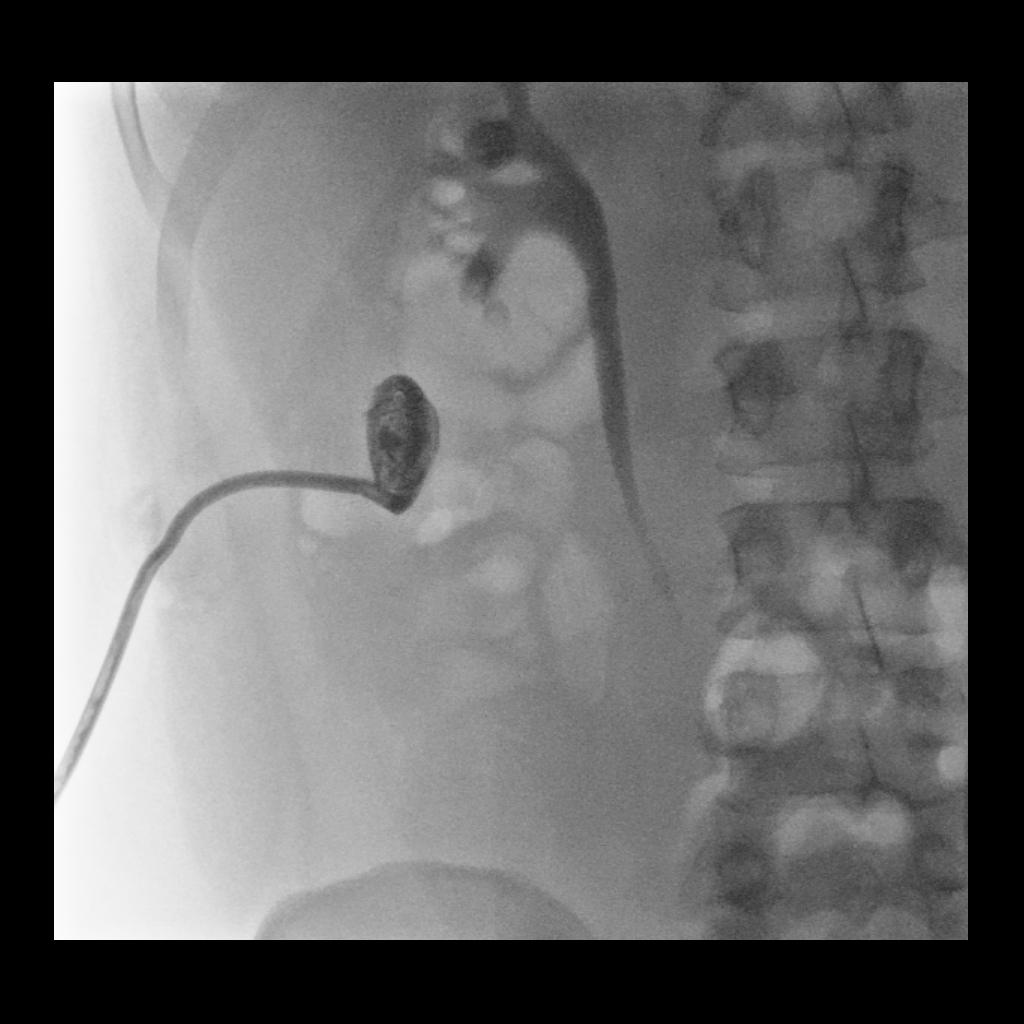
[frame 28/32]
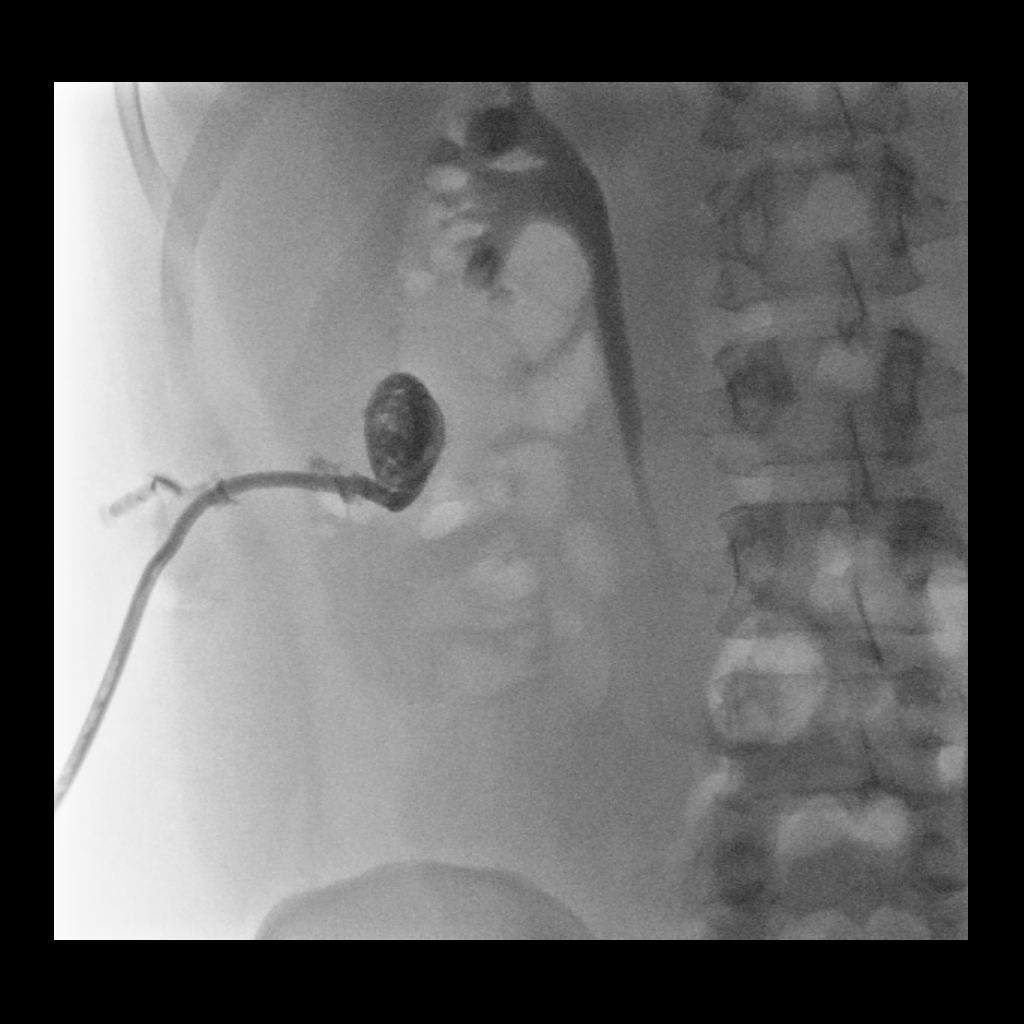
[frame 32/32]
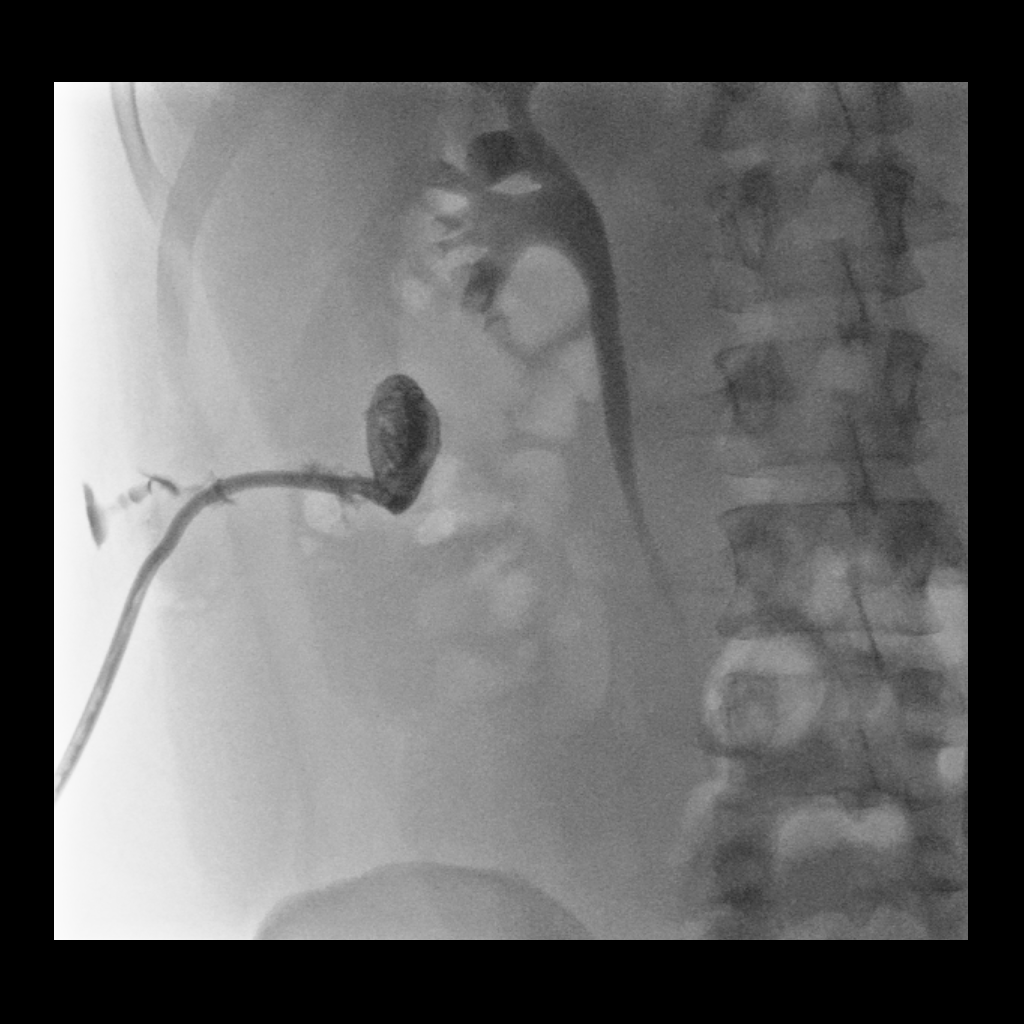

[5 of 5 positions shown; findings below may reference images not displayed]

Patient presents today to the [REDACTED]
for drainage catheter evaluation and management.

Preprocedural abdominal CT demonstrates complete resolution of the
perihepatic abscess and as such, patient now presents for
percutaneous drainage catheter injection.

Patient continues to flush the percutaneous drainage catheter.
Patient reports no significant output from the drainage catheter for
the past 2-3 days.

EXAM:
ABSCESS INJECTION
02/05/2017;
01/28/2017; CT-guided percutaneous drainage catheter placement -
01/29/2017; 02/05/2017; CT-guided percutaneous drainage catheter
repositioning exchange - 02/05/2017

CONTRAST:  10 cc Omnipaque 300, and administered via the existing
percutaneous drainage catheter.

FLUOROSCOPY TIME:  12 seconds (11 mGy)
A preprocedural spot fluoroscopic image was obtained of the right
mid abdomen and existing percutaneous drainage catheter.

Multiple spot fluoroscopic and radiographic images were obtained
following the injection of a small amount of contrast via the
existing percutaneous drainage catheter.

Images reviewed and the decision was made to remove the percutaneous
drainage catheter. The external portion of the drainage catheter was
cut and drainage catheters removed intact. A dressing was placed.
The patient tolerated the procedure well without immediate
postprocedural complication.
FINDINGS: Preprocedural spot fluoroscopic image demonstrates unchanged
positioning of the percutaneous catheter with end coiled and locked
overlying the peripheral aspect of the right mid abdomen. Excreted
contrast is seen within the right renal collecting system and
ureter.

Contrast injection demonstrates opacification of the decompressed
abscess cavity with reflux of contrast along the catheter tract to
the entrance site at the skin surface.

There is no evidence of fistulous connection to the adjacent bowel.
IMPRESSION: 1. Contrast injection demonstrates opacification of the perihepatic
abscess without evidence of fistulous connection.
2. Drainage catheter was removed at the patient's bedside without
incident.

## 2019-08-10 IMAGING — DX DG ABD PORTABLE 1V
1 series · 1 of 1 positions shown · non-contrast
Comparison: Abdominal radiograph of March 28, 2017

CLINICAL DATA: Patient is undergone NG tube placement. Small bowel
protocol.

EXAM:
PORTABLE ABDOMEN - 1 VIEW

[abdomen kub]
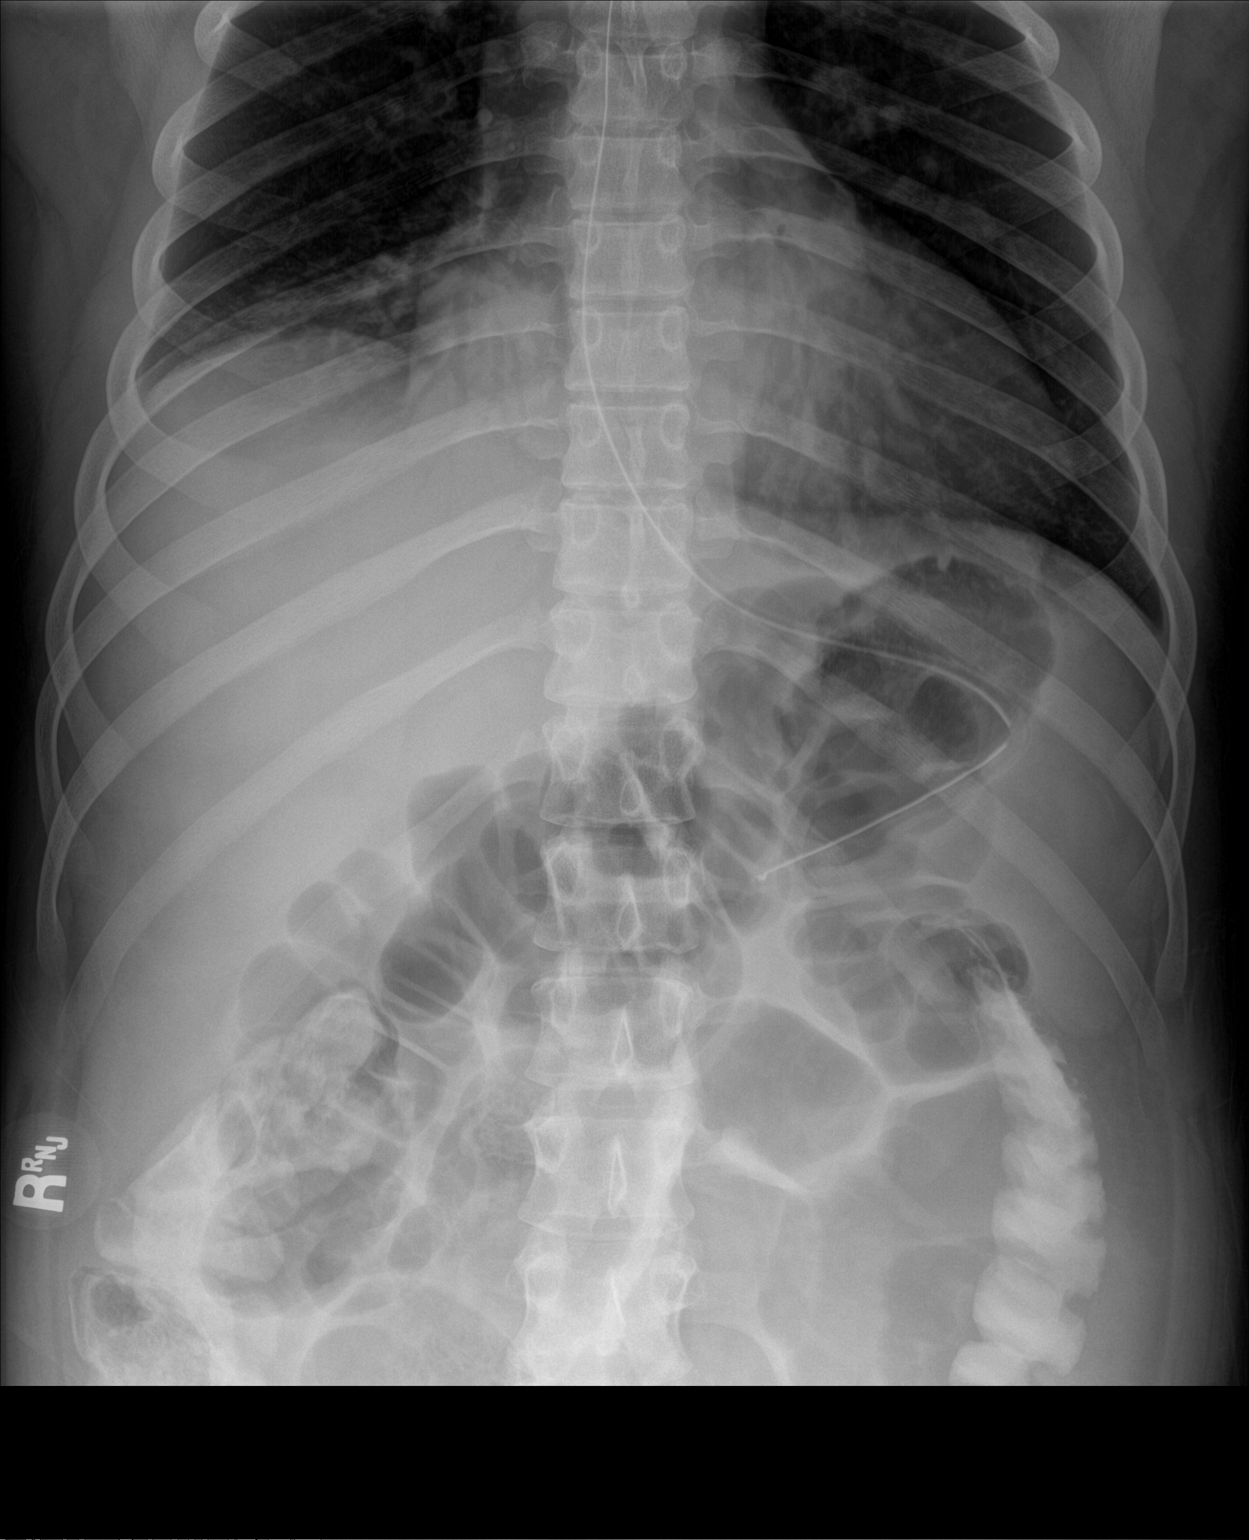

[1 of 1 positions shown; findings below may reference images not displayed]

FINDINGS: The nasogastric tube is been placed with the proximal port in the
gastric cardia and the tip in the midbody. There is dilation of
small bowel loops in the mid abdomen. There is contrast present in
normal caliber ascending and descending colon from the previous CT
scan [DATE].
IMPRESSION: Good positioning of the esophagogastric tube. Bowel gas pattern
consistent with a relatively high-grade small bowel obstruction.

## 2019-08-11 IMAGING — DX DG ABD PORTABLE 1V
1 series · 1 of 1 positions shown · non-contrast
Comparison: 03/28/2017

CLINICAL DATA: 8 hour small-bowel protocol delayed film.

EXAM:
PORTABLE ABDOMEN - 1 VIEW

[abdomen kub]
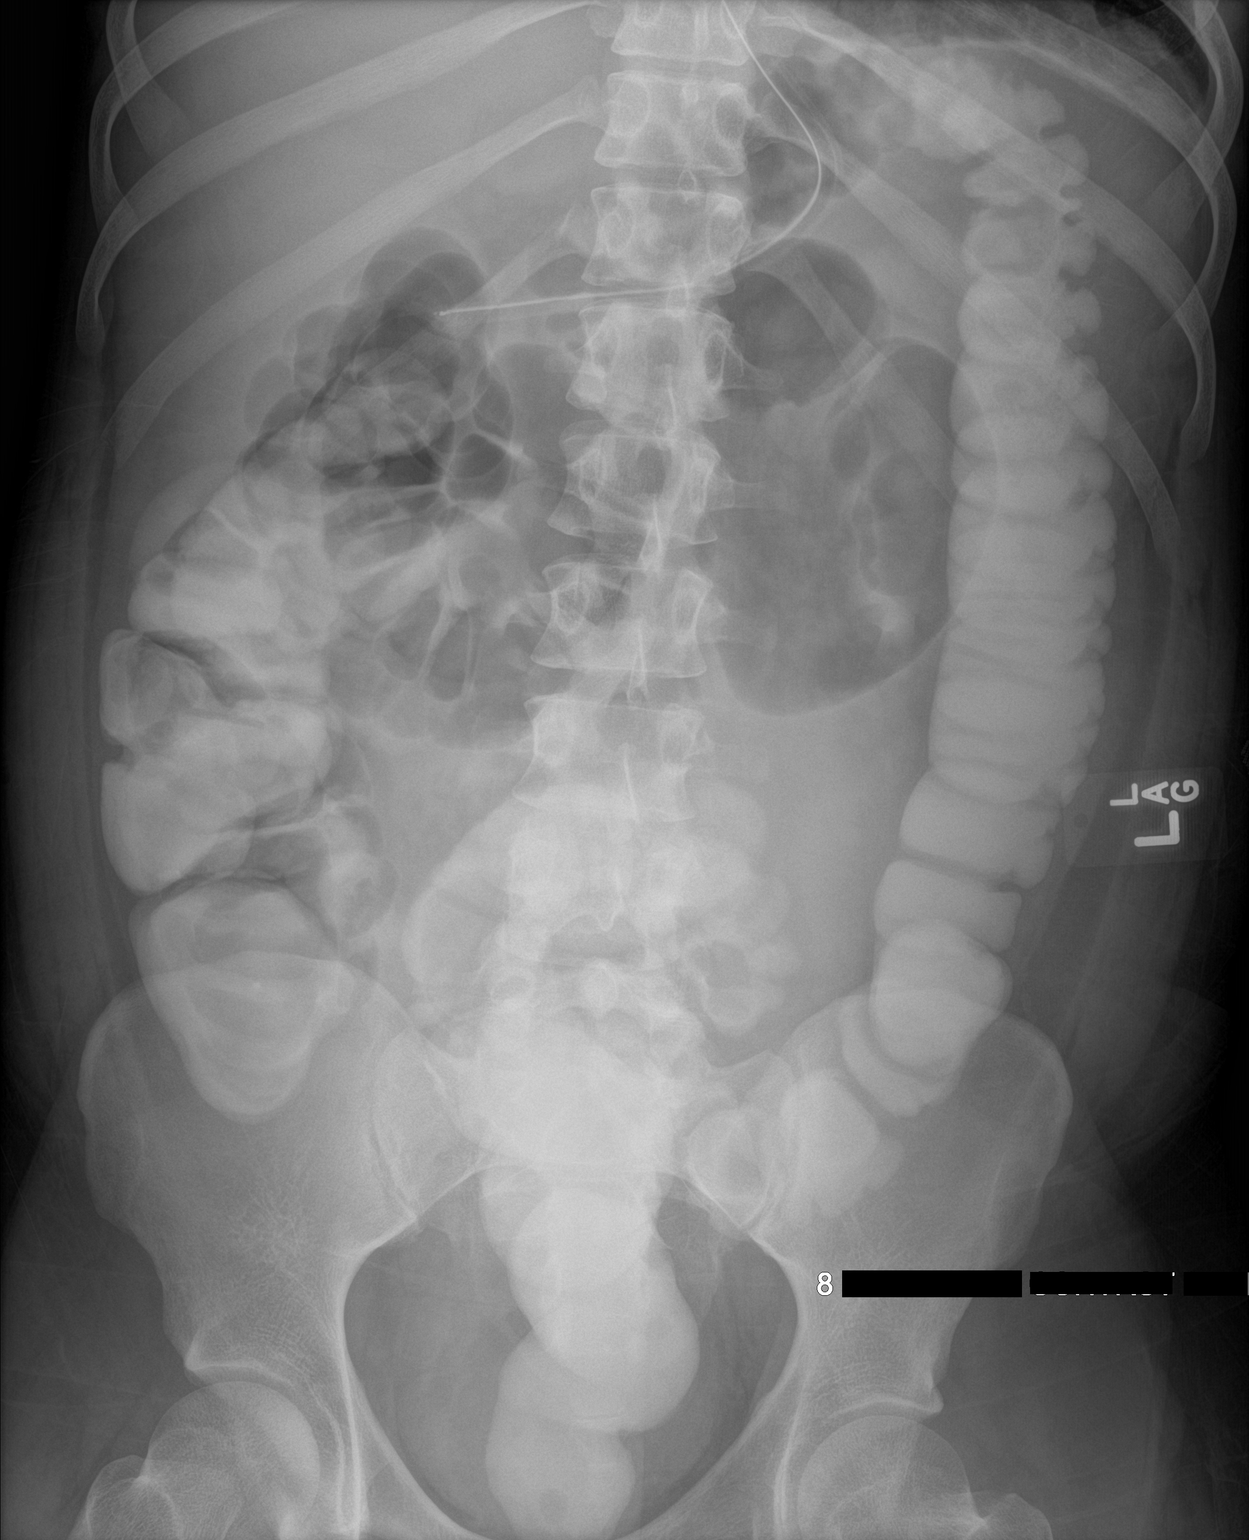

[1 of 1 positions shown; findings below may reference images not displayed]

FINDINGS: Contrast material is demonstrated throughout the colon. No
significant residual small-bowel contrast. Changes suggest no
evidence of complete obstruction. Dilated mid abdominal small bowel
are again demonstrated. Enteric tube tip is in the right upper
quadrant consistent with location in the distal stomach.
IMPRESSION: Contrast material throughout the colon without significant residual
small bowel contrast material. Appearance suggest no evidence of
complete obstruction.
